# Patient Record
Sex: Female | Born: 1955 | Race: Black or African American | Hispanic: No | Marital: Married | Smoking: Never smoker
Health system: Southern US, Community
[De-identification: ages and names within clinical notes are randomized; demographics above are authoritative.]

## PROBLEM LIST (undated history)

## (undated) DIAGNOSIS — E785 Hyperlipidemia, unspecified: Secondary | ICD-10-CM

## (undated) HISTORY — PX: ABDOMINAL HYSTERECTOMY: SHX81

---

## 2000-05-30 ENCOUNTER — Other Ambulatory Visit: Admission: RE | Admit: 2000-05-30 | Discharge: 2000-05-30 | Payer: Self-pay | Admitting: Gynecology

## 2001-12-12 ENCOUNTER — Other Ambulatory Visit: Admission: RE | Admit: 2001-12-12 | Discharge: 2001-12-12 | Payer: Self-pay | Admitting: Gynecology

## 2003-02-25 ENCOUNTER — Other Ambulatory Visit: Admission: RE | Admit: 2003-02-25 | Discharge: 2003-02-25 | Payer: Self-pay | Admitting: Gynecology

## 2003-05-06 ENCOUNTER — Inpatient Hospital Stay (HOSPITAL_COMMUNITY): Admission: RE | Admit: 2003-05-06 | Discharge: 2003-05-08 | Payer: Self-pay | Admitting: Gynecology

## 2004-11-06 ENCOUNTER — Encounter: Admission: RE | Admit: 2004-11-06 | Discharge: 2004-11-06 | Payer: Self-pay | Admitting: Family Medicine

## 2006-06-10 ENCOUNTER — Ambulatory Visit (HOSPITAL_COMMUNITY): Admission: RE | Admit: 2006-06-10 | Discharge: 2006-06-10 | Payer: Self-pay | Admitting: Gastroenterology

## 2014-06-27 ENCOUNTER — Other Ambulatory Visit: Payer: Self-pay | Admitting: Gynecology

## 2014-06-28 LAB — CYTOLOGY - PAP

## 2014-07-28 ENCOUNTER — Emergency Department (HOSPITAL_COMMUNITY)
Admission: EM | Admit: 2014-07-28 | Discharge: 2014-07-28 | Disposition: A | Discharge status: Home / Self Care | Payer: 59 | Source: Home / Self Care | Attending: Family Medicine | Admitting: Family Medicine

## 2014-07-28 ENCOUNTER — Encounter (HOSPITAL_COMMUNITY): Payer: Self-pay | Admitting: Emergency Medicine

## 2014-07-28 DIAGNOSIS — M7541 Impingement syndrome of right shoulder: Secondary | ICD-10-CM

## 2014-07-28 DIAGNOSIS — M25819 Other specified joint disorders, unspecified shoulder: Secondary | ICD-10-CM

## 2014-07-28 DIAGNOSIS — M7580 Other shoulder lesions, unspecified shoulder: Secondary | ICD-10-CM

## 2014-07-28 HISTORY — DX: Hyperlipidemia, unspecified: E78.5

## 2014-07-28 MED ORDER — TRAMADOL HCL 50 MG PO TABS: 50.0000 mg | ORAL_TABLET | Freq: Four times a day (QID) | ORAL | Status: DC | PRN

## 2014-07-28 MED ORDER — PREDNISONE 5 MG PO KIT: PACK | ORAL | Status: DC

## 2014-07-28 NOTE — ED Notes (Signed)
Assessment per Dr. Corey. 

## 2014-07-28 NOTE — ED Provider Notes (Signed)
Kimberly Sims is a 58 y.o. female who presents to Urgent Care today for shoulder pain. Patient is a three-day history of worsening right shoulder pain. She denies any injury. She notes the pain is severe and interfering with sleep. Additionally the pain is worse dramatically with overhand motion. She's unable to abduct her arm because of pain. Additionally reaching back causes severe pain. She denies any radiating pain weakness or numbness. She's tried multiple over-the-counter pain medications which have not helped any.   Past Medical History  Diagnosis Date  . Hyperlipidemia    History  Substance Use Topics  . Smoking status: Not on file  . Smokeless tobacco: Not on file  . Alcohol Use: No   ROS as above Medications: No current facility-administered medications for this encounter.   Current Outpatient Prescriptions  Medication Sig Dispense Refill  . Brimonidine Tartrate-Timolol (COMBIGAN OP) Apply to eye.      . Pitavastatin Calcium (LIVALO PO) Take by mouth.      . PredniSONE 5 MG KIT 12 day dosepack po  1 kit  0  . traMADol (ULTRAM) 50 MG tablet Take 1 tablet (50 mg total) by mouth every 6 (six) hours as needed.  15 tablet  0    Exam:  BP 138/68  Pulse 85  Temp(Src) 98 F (36.7 C) (Oral)  Resp 14  SpO2 100% Gen: Well NAD HEENT: EOMI,  MMM Lungs: Normal work of breathing. CTABL Heart: RRR no MRG Abd: NABS, Soft. Nondistended, Nontender Exts: Brisk capillary refill, warm and well perfused.  Neck: Nontender spinal midline. Normal neck range of motion. Right shoulder. Moderately tender palpation. Abduction is significantly limited to 70 active and passive. External rotation limited to 30 beyond the neutral position active and passive. Strength is diminished secondary to pain. Patient is unable to tolerate positioning for impingement testing.  Capillary refill pulses and sensation are intact bilateral upper extremities distally.  No results found for this or any previous  visit (from the past 24 hour(s)). No results found.  Assessment and Plan: 58 y.o. female with subacromial impingement or bursitis versus frozen shoulder. The rapidity of the severity onset is unlikely to be consistent with frozen shoulder. I am more suspicious of subacromial bursitis/impingement. Discussed options and offered corticosteroid injection. Patient declined. Will attempt trial of prednisone dose pack range of motion exercises and tramadol for pain control. Followup at sports medicine for further evaluation and management of this issue.  Discussed warning signs or symptoms. Please see discharge instructions. Patient expresses understanding.   This note was created using Systems analyst. Any transcription errors are unintended.    Gregor Hams, MD 07/28/14 [phone]

## 2014-07-28 NOTE — Discharge Instructions (Signed)
Thank you for coming in today. Do the gentle range of motion exercises we talked about.  Did not do exercises were the pain is greater than a 3/10. Take prednisone as directed Use tramadol for severe pain Followup with Dr. Katrinka Blazing   Impingement Syndrome, Rotator Cuff, Bursitis with Rehab Impingement syndrome is a condition that involves inflammation of the tendons of the rotator cuff and the subacromial bursa, that causes pain in the shoulder. The rotator cuff consists of four tendons and muscles that control much of the shoulder and upper arm function. The subacromial bursa is a fluid filled sac that helps reduce friction between the rotator cuff and one of the bones of the shoulder (acromion). Impingement syndrome is usually an overuse injury that causes swelling of the bursa (bursitis), swelling of the tendon (tendonitis), and/or a tear of the tendon (strain). Strains are classified into three categories. Grade 1 strains cause pain, but the tendon is not lengthened. Grade 2 strains include a lengthened ligament, due to the ligament being stretched or partially ruptured. With grade 2 strains there is still function, although the function may be decreased. Grade 3 strains include a complete tear of the tendon or muscle, and function is usually impaired. SYMPTOMS   Pain around the shoulder, often at the outer portion of the upper arm.  Pain that gets worse with shoulder function, especially when reaching overhead or lifting.  Sometimes, aching when not using the arm.  Pain that wakes you up at night.  Sometimes, tenderness, swelling, warmth, or redness over the affected area.  Loss of strength.  Limited motion of the shoulder, especially reaching behind the back (to the back pocket or to unhook bra) or across your body.  Crackling sound (crepitation) when moving the arm.  Biceps tendon pain and inflammation (in the front of the shoulder). Worse when bending the elbow or lifting. CAUSES    Impingement syndrome is often an overuse injury, in which chronic (repetitive) motions cause the tendons or bursa to become inflamed. A strain occurs when a force is paced on the tendon or muscle that is greater than it can withstand. Common mechanisms of injury include: Stress from sudden increase in duration, frequency, or intensity of training.  Direct hit (trauma) to the shoulder.  Aging, erosion of the tendon with normal use.  Bony bump on shoulder (acromial spur). RISK INCREASES WITH:  Contact sports (football, wrestling, boxing).  Throwing sports (baseball, tennis, volleyball).  Weightlifting and bodybuilding.  Heavy labor.  Previous injury to the rotator cuff, including impingement.  Poor shoulder strength and flexibility.  Failure to warm up properly before activity.  Inadequate protective equipment.  Old age.  Bony bump on shoulder (acromial spur). PREVENTION   Warm up and stretch properly before activity.  Allow for adequate recovery between workouts.  Maintain physical fitness:  Strength, flexibility, and endurance.  Cardiovascular fitness.  Learn and use proper exercise technique. PROGNOSIS  If treated properly, impingement syndrome usually goes away within 6 weeks. Sometimes surgery is required.  RELATED COMPLICATIONS   Longer healing time if not properly treated, or if not given enough time to heal.  Recurring symptoms, that result in a chronic condition.  Shoulder stiffness, frozen shoulder, or loss of motion.  Rotator cuff tendon tear.  Recurring symptoms, especially if activity is resumed too soon, with overuse, with a direct blow, or when using poor technique. TREATMENT  Treatment first involves the use of ice and medicine, to reduce pain and inflammation. The use of strengthening  and stretching exercises may help reduce pain with activity. These exercises may be performed at home or with a therapist. If non-surgical treatment is  unsuccessful after more than 6 months, surgery may be advised. After surgery and rehabilitation, activity is usually possible in 3 months.  MEDICATION  If pain medicine is needed, nonsteroidal anti-inflammatory medicines (aspirin and ibuprofen), or other minor pain relievers (acetaminophen), are often advised.  Do not take pain medicine for 7 days before surgery.  Prescription pain relievers may be given, if your caregiver thinks they are needed. Use only as directed and only as much as you need.  Corticosteroid injections may be given by your caregiver. These injections should be reserved for the most serious cases, because they may only be given a certain number of times. HEAT AND COLD  Cold treatment (icing) should be applied for 10 to 15 minutes every 2 to 3 hours for inflammation and pain, and immediately after activity that aggravates your symptoms. Use ice packs or an ice massage.  Heat treatment may be used before performing stretching and strengthening activities prescribed by your caregiver, physical therapist, or athletic trainer. Use a heat pack or a warm water soak. SEEK MEDICAL CARE IF:   Symptoms get worse or do not improve in 4 to 6 weeks, despite treatment.  New, unexplained symptoms develop. (Drugs used in treatment may produce side effects.) EXERCISES  RANGE OF MOTION (ROM) AND STRETCHING EXERCISES - Impingement Syndrome (Rotator Cuff  Tendinitis, Bursitis) These exercises may help you when beginning to rehabilitate your injury. Your symptoms may go away with or without further involvement from your physician, physical therapist or athletic trainer. While completing these exercises, remember:   Restoring tissue flexibility helps normal motion to return to the joints. This allows healthier, less painful movement and activity.  An effective stretch should be held for at least 30 seconds.  A stretch should never be painful. You should only feel a gentle lengthening or  release in the stretched tissue. STRETCH - Flexion, Standing  Stand with good posture. With an underhand grip on your right / left hand, and an overhand grip on the opposite hand, grasp a broomstick or cane so that your hands are a little more than shoulder width apart.  Keeping your right / left elbow straight and shoulder muscles relaxed, push the stick with your opposite hand, to raise your right / left arm in front of your body and then overhead. Raise your arm until you feel a stretch in your right / left shoulder, but before you have increased shoulder pain.  Try to avoid shrugging your right / left shoulder as your arm rises, by keeping your shoulder blade tucked down and toward your mid-back spine. Hold for __________ seconds.  Slowly return to the starting position. Repeat __________ times. Complete this exercise __________ times per day. STRETCH - Abduction, Supine  Lie on your back. With an underhand grip on your right / left hand and an overhand grip on the opposite hand, grasp a broomstick or cane so that your hands are a little more than shoulder width apart.  Keeping your right / left elbow straight and your shoulder muscles relaxed, push the stick with your opposite hand, to raise your right / left arm out to the side of your body and then overhead. Raise your arm until you feel a stretch in your right / left shoulder, but before you have increased shoulder pain.  Try to avoid shrugging your right / left shoulder  as your arm rises, by keeping your shoulder blade tucked down and toward your mid-back spine. Hold for __________ seconds.  Slowly return to the starting position. Repeat __________ times. Complete this exercise __________ times per day. ROM - Flexion, Active-Assisted  Lie on your back. You may bend your knees for comfort.  Grasp a broomstick or cane so your hands are about shoulder width apart. Your right / left hand should grip the end of the stick, so that your  hand is positioned "thumbs-up," as if you were about to shake hands.  Using your healthy arm to lead, raise your right / left arm overhead, until you feel a gentle stretch in your shoulder. Hold for __________ seconds.  Use the stick to assist in returning your right / left arm to its starting position. Repeat __________ times. Complete this exercise __________ times per day.  ROM - Internal Rotation, Supine   Lie on your back on a firm surface. Place your right / left elbow about 60 degrees away from your side. Elevate your elbow with a folded towel, so that the elbow and shoulder are the same height.  Using a broomstick or cane and your strong arm, pull your right / left hand toward your body until you feel a gentle stretch, but no increase in your shoulder pain. Keep your shoulder and elbow in place throughout the exercise.  Hold for __________ seconds. Slowly return to the starting position. Repeat __________ times. Complete this exercise __________ times per day. STRETCH - Internal Rotation  Place your right / left hand behind your back, palm up.  Throw a towel or belt over your opposite shoulder. Grasp the towel with your right / left hand.  While keeping an upright posture, gently pull up on the towel, until you feel a stretch in the front of your right / left shoulder.  Avoid shrugging your right / left shoulder as your arm rises, by keeping your shoulder blade tucked down and toward your mid-back spine.  Hold for __________ seconds. Release the stretch, by lowering your healthy hand. Repeat __________ times. Complete this exercise __________ times per day. ROM - Internal Rotation   Using an underhand grip, grasp a stick behind your back with both hands.  While standing upright with good posture, slide the stick up your back until you feel a mild stretch in the front of your shoulder.  Hold for __________ seconds. Slowly return to your starting position. Repeat __________  times. Complete this exercise __________ times per day.  STRETCH - Posterior Shoulder Capsule   Stand or sit with good posture. Grasp your right / left elbow and draw it across your chest, keeping it at the same height as your shoulder.  Pull your elbow, so your upper arm comes in closer to your chest. Pull until you feel a gentle stretch in the back of your shoulder.  Hold for __________ seconds. Repeat __________ times. Complete this exercise __________ times per day.  Document Released: 11/08/2005 Document Revised: 01/31/2012 Document Reviewed: 02/20/2009 [facility] Patient Information 2015 [place], [place]. This information is not intended to replace advice given to you by your health care provider. Make sure you discuss any questions you have with your health care provider.

## 2014-09-18 ENCOUNTER — Encounter: Payer: Self-pay | Admitting: Family Medicine

## 2014-09-18 ENCOUNTER — Ambulatory Visit (INDEPENDENT_AMBULATORY_CARE_PROVIDER_SITE_OTHER): Payer: 59 | Admitting: Family Medicine

## 2014-09-18 ENCOUNTER — Other Ambulatory Visit (INDEPENDENT_AMBULATORY_CARE_PROVIDER_SITE_OTHER): Payer: 59

## 2014-09-18 VITALS — BP 130/82 | HR 73 | Ht 67.0 in | Wt 142.0 lb

## 2014-09-18 DIAGNOSIS — M7551 Bursitis of right shoulder: Secondary | ICD-10-CM

## 2014-09-18 NOTE — Patient Instructions (Addendum)
Very nice to meet you Ice 20 minutes 2 times daily. Usually after activity and before bed. Exercises 3 times a week.  Vitamin D 2000 IU daily.  Turmeric 500mg  twice daily Come back and see m e again in 3 weeks

## 2014-09-18 NOTE — Progress Notes (Signed)
Kimberly Sims [address] [place], [place] [address] Phone: [phone] Subjective:    I'm seeing this patient by the request  of:  Dr. Denyse Amass  CC: right shoulder pain  [id number] Kimberly Sims is a 58 y.o. female coming in with complaint of right shoulder pain. Patient was seen greater than 6 weeks ago in urgent care for a right shoulder impingement. Patient states unfortunately her pain did get better for a short course of time but then seemed to get worse. Patient states that she has a severe pain that even interfering with her sleep. Patient states it is worse with overhead activity or trying to reach but kind her back. Patient denies any significant radiation down the arm or any weakness or numbness. Patient puts the severity of pain a 7 out of 10. Patient has tried many over-the-counter medications without any significant improvement. Patient was given prednisone as well as tramadol at urgent care which was helpful. Unfortunately when she ran out of medications the pain seemed to come back.     Past medical history, social, surgical and family history all reviewed in electronic medical record.   Review of Systems: No headache, visual changes, nausea, vomiting, diarrhea, constipation, dizziness, abdominal pain, skin rash, fevers, chills, night sweats, weight loss, swollen lymph nodes, body aches, joint swelling, muscle aches, chest pain, shortness of breath, mood changes.   Objective Blood pressure 130/82, pulse 73, height 5\' 7"  (1.702 m), weight 142 lb (64.411 kg), SpO2 97.00%.  General: No apparent distress alert and oriented x3 mood and affect normal, dressed appropriately.  HEENT: Pupils equal, extraocular movements intact  Respiratory: Patient's speak in full sentences and does not appear short of breath  Cardiovascular: No lower extremity edema, non tender, no erythema  Skin: Warm dry intact with no signs of infection or rash on extremities or on axial  skeleton.  Abdomen: Soft nontender  Neuro: Cranial nerves II through XII are intact, neurovascularly intact in all extremities with 2+ DTRs and 2+ pulses.  Lymph: No lymphadenopathy of posterior or anterior cervical chain or axillae bilaterally.  Gait normal with good balance and coordination.  MSK:  Non tender with full range of motion and good stability and symmetric strength and tone of  elbows, wrist, hip, knee and ankles bilaterally.  Shoulder: Right Inspection reveals no abnormalities, atrophy or asymmetry. Palpation is normal with no tenderness over AC joint or bicipital groove. ROM is full in all planes passively. Rotator cuff strength normal throughout. signs of impingement with positive Neer and Hawkin's tests, but negative empty can sign. Speeds and Yergason's tests normal. No labral pathology noted with negative Obrien's, negative clunk and good stability. Normal scapular function observed. No painful arc and no drop arm sign. No apprehension sign  MSK performed of: Right This study was ordered, performed, and interpreted by [place] D.O.  Shoulder:   Supraspinatus:  Appears normal on long and transverse views, Bursal bulge seen with shoulder abduction on impingement view. Infraspinatus:  Appears normal on long and transverse views. Significant increase in Doppler flow Subscapularis:  Appears normal on long and transverse views. Positive bursa Teres Minor:  Appears normal on long and transverse views. AC joint:  Capsule undistended, no geyser sign. Glenohumeral Joint:  Appears normal without effusion. Glenoid Labrum:  Intact without visualized tears. Biceps Tendon:  Appears normal on long and transverse views, no fraying of tendon, tendon located in intertubercular groove, no subluxation with shoulder internal or external rotation.  Impression: Subacromial bursitis  Procedure: Real-time Ultrasound Guided Injection of right glenohumeral joint Device: GE Logiq E    Ultrasound guided injection is preferred based studies that show increased duration, increased effect, greater accuracy, decreased procedural pain, increased response rate with ultrasound guided versus blind injection.  Verbal informed consent obtained.  Time-out conducted.  Noted no overlying erythema, induration, or other signs of local infection.  Skin prepped in a sterile fashion.  Local anesthesia: Topical Ethyl chloride.  With sterile technique and under real time ultrasound guidance:  Joint visualized.  23g 1  inch needle inserted posterior approach. Pictures taken for needle placement. Patient did have injection of 2 cc of 1% lidocaine, 2 cc of 0.5% Marcaine, and 1.0 cc of Kenalog 40 mg/dL. Completed without difficulty  Pain immediately resolved suggesting accurate placement of the medication.  Advised to call if fevers/chills, erythema, induration, drainage, or persistent bleeding.  Images permanently stored and available for review in the ultrasound unit.  Impression: Technically successful ultrasound guided injection.    Impression and Recommendations:     This case required medical decision making of moderate complexity.

## 2014-09-18 NOTE — Assessment & Plan Note (Signed)
Patient had an injection today and tolerated the procedure very well. We discussed home exercises, icing protocol, as well as over-the-counter medications he can be beneficial. Patient will try these interventions and come back and see me in 3 weeks for further evaluation and treatment.

## 2014-10-10 ENCOUNTER — Ambulatory Visit (INDEPENDENT_AMBULATORY_CARE_PROVIDER_SITE_OTHER): Payer: 59 | Admitting: Family Medicine

## 2014-10-10 VITALS — BP 118/72 | HR 78 | Ht 67.0 in | Wt 141.0 lb

## 2014-10-10 DIAGNOSIS — M7551 Bursitis of right shoulder: Secondary | ICD-10-CM

## 2014-10-10 NOTE — Assessment & Plan Note (Signed)
Patient's pain is near completely resolved at this time and is asymptomatic on exam today. We discussed icing protocol. We discussed continuing the exercises 2 times a week for the next 6 weeks. Lungs patient continues to improve she will follow-up on an as-needed basis.

## 2014-10-10 NOTE — Patient Instructions (Addendum)
Good to see you Ice will always will be your friend.  Exercises 2 times a week for 6 weeks See me when you need me.

## 2014-10-10 NOTE — Progress Notes (Signed)
  [facility] [address] [place], [place] [address] Phone: [phone] Subjective:    I'm seeing this patient by the request  of:  Dr. Denyse Amass  CC: right shoulder pain follow up [id number] Kimberly Sims is a 58 y.o. female coming in with complaint of right shoulder pain. Patient was seen and had more of a subacromial bursitis giving her the impingement syndrome of the right shoulder. Patient was given a corticosteroid injection, home exercises, icing protocol. Patient states she is 90% better. No real pain, can do all ADL's. Patient states that she is resting comfortably. No new symptoms.     Past medical history, social, surgical and family history all reviewed in electronic medical record.   Review of Systems: No headache, visual changes, nausea, vomiting, diarrhea, constipation, dizziness, abdominal pain, skin rash, fevers, chills, night sweats, weight loss, swollen lymph nodes, body aches, joint swelling, muscle aches, chest pain, shortness of breath, mood changes.   Objective Blood pressure 118/72, pulse 78, height 5\' 7"  (1.702 m), weight 141 lb (63.957 kg), SpO2 98 %.  General: No apparent distress alert and oriented x3 mood and affect normal, dressed appropriately.  HEENT: Pupils equal, extraocular movements intact  Respiratory: Patient's speak in full sentences and does not appear short of breath  Cardiovascular: No lower extremity edema, non tender, no erythema  Skin: Warm dry intact with no signs of infection or rash on extremities or on axial skeleton.  Abdomen: Soft nontender  Neuro: Cranial nerves II through XII are intact, neurovascularly intact in all extremities with 2+ DTRs and 2+ pulses.  Lymph: No lymphadenopathy of posterior or anterior cervical chain or axillae bilaterally.  Gait normal with good balance and coordination.  MSK:  Non tender with full range of motion and good stability and symmetric strength and tone of  elbows, wrist, hip,  knee and ankles bilaterally.  Shoulder: Right Inspection reveals no abnormalities, atrophy or asymmetry. Palpation is normal with no tenderness over AC joint or bicipital groove. ROM is full in all planes passively. Rotator cuff strength normal throughout. Negative impingement signs Speeds and Yergason's tests normal. No labral pathology noted with negative Obrien's, negative clunk and good stability. Normal scapular function observed. No painful arc and no drop arm sign. No apprehension sign    Impression and Recommendations:     This case required medical decision making of moderate complexity.

## 2015-07-29 ENCOUNTER — Encounter (HOSPITAL_COMMUNITY): Payer: Self-pay | Admitting: Emergency Medicine

## 2015-07-29 ENCOUNTER — Emergency Department (HOSPITAL_COMMUNITY)
Admission: EM | Admit: 2015-07-29 | Discharge: 2015-07-29 | Disposition: A | Discharge status: Home / Self Care | Payer: 59 | Source: Home / Self Care | Attending: Emergency Medicine | Admitting: Emergency Medicine

## 2015-07-29 DIAGNOSIS — M79641 Pain in right hand: Secondary | ICD-10-CM

## 2015-07-29 MED ORDER — KETOROLAC TROMETHAMINE 60 MG/2ML IM SOLN
60.0000 mg | Freq: Once | INTRAMUSCULAR | Status: AC
  Administered 2015-07-29: 60 mg via INTRAMUSCULAR

## 2015-07-29 MED ORDER — CEPHALEXIN 500 MG PO CAPS: 500.0000 mg | ORAL_CAPSULE | Freq: Four times a day (QID) | ORAL | Status: DC

## 2015-07-29 MED ORDER — NAPROXEN 500 MG PO TABS: 500.0000 mg | ORAL_TABLET | Freq: Two times a day (BID) | ORAL | Status: DC

## 2015-07-29 MED ORDER — TRAMADOL HCL 50 MG PO TABS: 50.0000 mg | ORAL_TABLET | Freq: Four times a day (QID) | ORAL | Status: DC | PRN

## 2015-07-29 MED ORDER — KETOROLAC TROMETHAMINE 60 MG/2ML IM SOLN
INTRAMUSCULAR | Status: AC
  Filled 2015-07-29: qty 2

## 2015-07-29 NOTE — ED Notes (Signed)
Pt states she has pain in her 3rd finger on her right hand that radiates to her wrist and elbow.  She denies any injury to the hand or arm, but states she did yard work yesterday.  She has a history of what she called tendonitis in her right shoulder about one year ago.  Pt states the pain started last night and is getting progressively worse with swelling in her hand as well.

## 2015-07-29 NOTE — Discharge Instructions (Signed)
You have likely irritated the flexor tendon of your middle finger. With the swelling and mild redness, we are also going to treat for infection. Take Naprosyn twice a day for the next week, then as needed. Take Keflex 4 times a day for the next week. Use tramadol every 6-8 hours as needed for severe pain. Do not drive while taking this medicine. Apply ice to the palmar hand and forearm at least 3 times a day. Please follow-up with your primary care doctor in 2-3 days for a recheck. If symptoms are worsening, please come back.

## 2015-07-29 NOTE — ED Provider Notes (Signed)
CSN: [id number]     Arrival date & time 07/29/15  1302 History   First MD Initiated Contact with Patient 07/29/15 1329     Chief Complaint  Patient presents with  . Hand Pain  . Arm Pain   (Consider location/radiation/quality/duration/timing/severity/associated sxs/prior Treatment) HPI  She is a 59 year old woman here for evaluation of right hand and forearm pain. She states she did yard work yesterday which is unusual for her. A few hours later she developed pain in her right middle finger that radiates up her palm and into the forearm. It is worse with movement of the middle finger. She also reports some pain with movement of the other digits, but not as bad as with the middle finger. She also reports some mild discomfort with pronation and supination. She reports swelling in the middle finger and some redness. She is unable to hold a fork work brush her hair due to the pain. She is right-handed. She denies any specific injury or trauma. No fevers or chills.  Past Medical History  Diagnosis Date  . Hyperlipidemia    Past Surgical History  Procedure Laterality Date  . Abdominal hysterectomy     History reviewed. No pertinent family history. Social History  Substance Use Topics  . Smoking status: Never Smoker   . Smokeless tobacco: None  . Alcohol Use: No   OB History    No data available     Review of Systems As in history of present illness Allergies  Darvon and Motrin  Home Medications   Prior to Admission medications   Medication Sig Start Date End Date Taking? Authorizing Provider  Brimonidine Tartrate-Timolol (COMBIGAN OP) Apply to eye.    Historical Provider, MD  cephALEXin (KEFLEX) 500 MG capsule Take 1 capsule (500 mg total) by mouth 4 (four) times daily. 07/29/15   Charm Rings, MD  naproxen (NAPROSYN) 500 MG tablet Take 1 tablet (500 mg total) by mouth 2 (two) times daily. 07/29/15   Charm Rings, MD  Pitavastatin Calcium (LIVALO PO) Take by mouth.    Historical  Provider, MD  traMADol (ULTRAM) 50 MG tablet Take 1 tablet (50 mg total) by mouth every 6 (six) hours as needed. 07/29/15   Charm Rings, MD   Meds Ordered and Administered this Visit   Medications  ketorolac (TORADOL) injection 60 mg (not administered)    BP 152/89 mmHg  Pulse 76  Temp(Src) 99.8 F (37.7 C) (Oral)  Resp 16  SpO2 97% No data found.   Physical Exam  Constitutional: She is oriented to person, place, and time. She appears well-developed and well-nourished. No distress.  Visibly uncomfortable with exam of hand.  Cardiovascular: Normal rate.   Pulmonary/Chest: Effort normal.  Musculoskeletal:  Right hand: There is some swelling and erythema, most prominently at the proximal palmar middle finger. No tenderness to palpation along the middle finger. She does have some tenderness to palpation of the volar forearm. She has pain with both passive and active flexion and extension of the middle finger. Brisk cap refill in all digits.  Neurological: She is alert and oriented to person, place, and time.    ED Course  Procedures (including critical care time)  Labs Review Labs Reviewed - No data to display  Imaging Review No results found.    MDM   1. Right hand pain    She most likely irritated her flexor tendon doing yard work yesterday. However, she does have a low-grade temperature at 99.8 and there  is some swelling and redness. Toradol 60 mg IM given for pain. We'll cover for tendinitis with Naprosyn and tramadol. Recommended frequent icing. Keflex for one week to cover possible infection. Follow-up with PCP in 2-3 days for recheck. Return precautions reviewed.    Charm Rings, MD 07/29/15 1400

## 2015-12-10 MED FILL — COMBIGAN EYE DROPS: 0.2-0.5 | 15 days supply | Qty: 5 | Fill #1

## 2015-12-15 DIAGNOSIS — H04123 Dry eye syndrome of bilateral lacrimal glands: Secondary | ICD-10-CM | POA: Diagnosis not present

## 2015-12-15 DIAGNOSIS — H25011 Cortical age-related cataract, right eye: Secondary | ICD-10-CM | POA: Diagnosis not present

## 2015-12-15 DIAGNOSIS — H2511 Age-related nuclear cataract, right eye: Secondary | ICD-10-CM | POA: Diagnosis not present

## 2015-12-15 DIAGNOSIS — H401130 Primary open-angle glaucoma, bilateral, stage unspecified: Secondary | ICD-10-CM | POA: Diagnosis not present

## 2016-01-08 MED FILL — TRAVATAN Z 0.004% EYE DROP: 0.004 | 25 days supply | Qty: 3 | Fill #2

## 2016-01-08 MED FILL — COMBIGAN EYE DROPS: 0.2-0.5 | 15 days supply | Qty: 5 | Fill #2

## 2016-01-08 MED FILL — VIT D2 1.25 MG (50,000 UNIT: 1.25 MG | 84 days supply | Qty: 12 | Fill #0

## 2016-01-08 MED FILL — DORZOLAMIDE HCL 2% EYE DROP: 2 | 50 days supply | Qty: 10 | Fill #2

## 2016-02-11 ENCOUNTER — Encounter: Payer: Self-pay | Admitting: Family Medicine

## 2016-02-11 ENCOUNTER — Ambulatory Visit (INDEPENDENT_AMBULATORY_CARE_PROVIDER_SITE_OTHER): Payer: 59 | Admitting: Family Medicine

## 2016-02-11 ENCOUNTER — Other Ambulatory Visit (INDEPENDENT_AMBULATORY_CARE_PROVIDER_SITE_OTHER): Payer: 59

## 2016-02-11 ENCOUNTER — Ambulatory Visit (INDEPENDENT_AMBULATORY_CARE_PROVIDER_SITE_OTHER)
Admission: RE | Admit: 2016-02-11 | Discharge: 2016-02-11 | Disposition: A | Discharge status: Home / Self Care | Payer: 59 | Source: Ambulatory Visit | Attending: Family Medicine | Admitting: Family Medicine

## 2016-02-11 VITALS — BP 126/80 | HR 62 | Wt 148.0 lb

## 2016-02-11 DIAGNOSIS — M47812 Spondylosis without myelopathy or radiculopathy, cervical region: Secondary | ICD-10-CM | POA: Diagnosis not present

## 2016-02-11 DIAGNOSIS — M25512 Pain in left shoulder: Secondary | ICD-10-CM

## 2016-02-11 DIAGNOSIS — M25511 Pain in right shoulder: Secondary | ICD-10-CM

## 2016-02-11 DIAGNOSIS — M5010 Cervical disc disorder with radiculopathy, unspecified cervical region: Secondary | ICD-10-CM | POA: Insufficient documentation

## 2016-02-11 MED ORDER — DICLOFENAC SODIUM 2 % TD SOLN: TRANSDERMAL | Status: AC

## 2016-02-11 NOTE — Assessment & Plan Note (Signed)
Patient given injection in the shoulders and a bilaterally. I'm hoping that this will help. I am concerned the patient is having more cervical radiculopathy. Patient will have x-rays done today. Discussed icing, given prescription for topical anti-inflammatories with patient wondering to avoid medications. Patient also wants to avoid any formal physical therapy at this time. Patient coming back and see me again in 2 weeks. If worsening symptoms we'll need to consider advanced imaging of the neck.

## 2016-02-11 NOTE — Progress Notes (Signed)
Pre visit review using our clinic review tool, if applicable. No additional management support is needed unless otherwise documented below in the visit note. 

## 2016-02-11 NOTE — Patient Instructions (Signed)
Good to see you  Ice 20 minutes 2 times daily. Usually after activity and before bed. pennsaid pinkie amount topically 2 times daily as needed.  Avoid any movement where hands are out of your peripheral vision  Stay active though and keep moving.  Vitamin D 2000 IU daily  Xray of neck downstairs today  See me again in 2 weeks. If not better we will need to look at your neck a lot closer.

## 2016-02-11 NOTE — Progress Notes (Signed)
[facility] [address] [place], [place] [address] Phone: [phone] Subjective:      CC: Bilateral shoulder pain  [name] Kimberly Sims is a 60 y.o. female coming in with complaint of right shoulder pain. Patient was seen and a half ago and was diagnosed with more of a bursitis of the right shoulder. Patient was given an injection, home exercises as well as some medications. Patient was doing much better. Patient states she is having bilateral shoulder pain now. Seems to be giving her some radiation down the arm. Patient states that there is some numbness in her fingers that seems to be intermittent. Patient states that she does not have any significant neck pain. Does not remember any true injury. Denies any weakness. States that though she cannot find any comfortable position at night. Patient states that she has to sleep on her back and if she will stay the shoulder she has severe amount of pain. Patient has tried over-the-counter anti-inflammatories with very minimal benefit. Patient rates the severity of pain a 6 out of 10.     Past Medical History  Diagnosis Date  . Hyperlipidemia    Past Surgical History  Procedure Laterality Date  . Abdominal hysterectomy     Social History  Substance Use Topics  . Smoking status: Never Smoker   . Smokeless tobacco: None  . Alcohol Use: No   Allergies  Allergen Reactions  . Darvon [Propoxyphene]   . Motrin [Ibuprofen]     Large doses Motrin cause n/v, GI upset   No family history on file.no family history of rheumatological diseases   Past medical history, social, surgical and family history all reviewed in electronic medical record.   Review of Systems: No headache, visual changes, nausea, vomiting, diarrhea, constipation, dizziness, abdominal pain, skin rash, fevers, chills, night sweats, weight loss, swollen lymph nodes, body aches, joint swelling, muscle aches, chest pain, shortness of breath,  mood changes.   Objective Blood pressure 126/80, pulse 62, weight 148 lb (67.132 kg).  General: No apparent distress alert and oriented x3 mood and affect normal, dressed appropriately.  HEENT: Pupils equal, extraocular movements intact  Respiratory: Patient's speak in full sentences and does not appear short of breath  Cardiovascular: No lower extremity edema, non tender, no erythema  Skin: Warm dry intact with no signs of infection or rash on extremities or on axial skeleton.  Abdomen: Soft nontender  Neuro: Cranial nerves II through XII are intact, neurovascularly intact in all extremities with 2+ DTRs and 2+ pulses.  Lymph: No lymphadenopathy of posterior or anterior cervical chain or axillae bilaterally.  Gait normal with good balance and coordination.  MSK:  Non tender with full range of motion and good stability and symmetric strength and tone of  elbows, wrist, hip, knee and ankles bilaterally.  Neck: Inspection unremarkable. No palpable stepoffs. Negative Spurling's maneuver. Full neck range of motion Grip strength and sensation normal in bilateral hands Strength good C4 to T1 distribution No sensory change to C4 to T1 Negative Hoffman sign bilaterally Reflexes normal  Shoulder: bilateral Inspection reveals no abnormalities, atrophy or asymmetry. Palpation is normal with no tenderness over AC joint or bicipital groove. ROM is full in all planes passively. Rotator cuff strength normal throughout. signs of impingement with positive Neer and Hawkin's tests, but negative empty can sign. Speeds and Yergason's tests normal. No labral pathology noted with negative Obrien's, negative clunk and good stability. Normal scapular function observed. No painful arc  and no drop arm sign. No apprehension sign  MSK US performed of: bilateral This study was ordered, performed, and interpreted by Terrilee Files D.O.  Shoulder:   Supraspinatus:  Appears normal on long and transverse views,  Bursal bulge seen with shoulder abduction on impingement view. Infraspinatus:  Appears normal on long and transverse views. Significant increase in Doppler flow Subscapularis:  Appears normal on long and transverse views. Positive bursa Teres Minor:  Appears normal on long and transverse views. AC joint:  Capsule undistended, no geyser sign. Glenohumeral Joint:  Appears normal without effusion. Glenoid Labrum:  Intact without visualized tears. Biceps Tendon:  Appears normal on long and transverse views, no fraying of tendon, tendon located in intertubercular groove, no subluxation with shoulder internal or external rotation.  Impression: Subacromial bursitis bilaterally  Procedure: Real-time Ultrasound Guided Injection of right glenohumeral joint Device: GE Logiq E  Ultrasound guided injection is preferred based studies that show increased duration, increased effect, greater accuracy, decreased procedural pain, increased response rate with ultrasound guided versus blind injection.  Verbal informed consent obtained.  Time-out conducted.  Noted no overlying erythema, induration, or other signs of local infection.  Skin prepped in a sterile fashion.  Local anesthesia: Topical Ethyl chloride.  With sterile technique and under real time ultrasound guidance:  Joint visualized.  23g 1  inch needle inserted posterior approach. Pictures taken for needle placement. Patient did have injection of 2 cc of 1% lidocaine, 2 cc of 0.5% Marcaine, and 1.0 cc of Kenalog 40 mg/dL. Completed without difficulty  Pain immediately resolved suggesting accurate placement of the medication.  Advised to call if fevers/chills, erythema, induration, drainage, or persistent bleeding.  Images permanently stored and available for review in the ultrasound unit.  Impression: Technically successful ultrasound guided injection.  ]Procedure: Real-time Ultrasound Guided Injection of left glenohumeral joint Device: GE Logiq E    Ultrasound guided injection is preferred based studies that show increased duration, increased effect, greater accuracy, decreased procedural pain, increased response rate with ultrasound guided versus blind injection.  Verbal informed consent obtained.  Time-out conducted.  Noted no overlying erythema, induration, or other signs of local infection.  Skin prepped in a sterile fashion.  Local anesthesia: Topical Ethyl chloride.  With sterile technique and under real time ultrasound guidance:  Joint visualized.  23g 1  inch needle inserted posterior approach. Pictures taken for needle placement. Patient did have injection of 2 cc of 1% lidocaine, 2 cc of 0.5% Marcaine, and 1cc of Kenalog 40 mg/dL. Completed without difficulty  Pain immediately resolved suggesting accurate placement of the medication.  Advised to call if fevers/chills, erythema, induration, drainage, or persistent bleeding.  Images permanently stored and available for review in the ultrasound unit.  Impression: Technically successful ultrasound guided injection.    Impression and Recommendations:     This case required medical decision making of moderate complexity.

## 2016-02-23 ENCOUNTER — Encounter: Payer: Self-pay | Admitting: Family Medicine

## 2016-02-23 ENCOUNTER — Ambulatory Visit (INDEPENDENT_AMBULATORY_CARE_PROVIDER_SITE_OTHER): Payer: 59 | Admitting: Family Medicine

## 2016-02-23 VITALS — BP 126/78 | HR 68 | Wt 142.0 lb

## 2016-02-23 DIAGNOSIS — M25512 Pain in left shoulder: Secondary | ICD-10-CM

## 2016-02-23 DIAGNOSIS — M5010 Cervical disc disorder with radiculopathy, unspecified cervical region: Secondary | ICD-10-CM

## 2016-02-23 DIAGNOSIS — M25511 Pain in right shoulder: Secondary | ICD-10-CM

## 2016-02-23 MED ORDER — GABAPENTIN 100 MG PO CAPS: 200.0000 mg | ORAL_CAPSULE | Freq: Every day | ORAL | Status: AC

## 2016-02-23 MED FILL — GABAPENTIN 100 MG CAPSULE: 100 | 30 days supply | Qty: 60 | Fill #0

## 2016-02-23 NOTE — Assessment & Plan Note (Signed)
Pain resolved after injection. Continues to have the radicular's. No significant weakness. We discussed the icing regimen. Decline formal physical therapy. We will continue to monitor closely. Follow-up in 6 weeks.

## 2016-02-23 NOTE — Patient Instructions (Addendum)
Good to see you  Ice is your friend Stay active and watch the posture Exercises still 2 times a week Start with gabapentin 100mg  at night for 1 week and if better continue and if not then try 200mg  at night for a week See me again in 6 weeks and send message in 3 weeks so I know how you are doing.

## 2016-02-23 NOTE — Progress Notes (Signed)
  Kimberly Sims [address] [place], [place] [address] Phone: [phone] Subjective:      CC: Bilateral shoulder painFollow-up  [id number]:[id number] Kimberly Sims is a 60 y.o. female coming in with complaint of bilateral shoulder pain. Patient was found to have some subacromial bursitis bilaterally and patient elected to have injections in each shoulder. Patient was also sent to have x-rays of her neck. These were independently visualized by me showing patient having mild osteophytic changes and cervical degenerative disc disease. Patient was given exercises, topical anti-inflammatories, icing protocol. Patient states she is doing better at this time. States that she is no longer having any pain in the shoulders. Continues to have unfortunately little numbness in the hands. Seems to be the middle finger and the thumb bilaterally. Patient states that it is only at night. No pain in the morning. Not stopping any activities.'s     Past Medical History  Diagnosis Date  . Hyperlipidemia    Past Surgical History  Procedure Laterality Date  . Abdominal hysterectomy     Social History  Substance Use Topics  . Smoking status: Never Smoker   . Smokeless tobacco: Not on file  . Alcohol Use: No   Allergies  Allergen Reactions  . Darvon [Propoxyphene]   . Motrin [Ibuprofen]     Large doses Motrin cause n/v, GI upset   No family history on file.no family history of rheumatological diseases   Past medical history, social, surgical and family history all reviewed in electronic medical record.   Review of Systems: No headache, visual changes, nausea, vomiting, diarrhea, constipation, dizziness, abdominal pain, skin rash, fevers, chills, night sweats, weight loss, swollen lymph nodes, body aches, joint swelling, muscle aches, chest pain, shortness of breath, mood changes.   Objective Blood pressure 126/78, pulse 68, weight 142 lb (64.411 kg).  General: No apparent  distress alert and oriented x3 mood and affect normal, dressed appropriately.  HEENT: Pupils equal, extraocular movements intact  Respiratory: Patient's speak in full sentences and does not appear short of breath  Cardiovascular: No lower extremity edema, non tender, no erythema  Skin: Warm dry intact with no signs of infection or rash on extremities or on axial skeleton.  Abdomen: Soft nontender  Neuro: Cranial nerves II through XII are intact, neurovascularly intact in all extremities with 2+ DTRs and 2+ pulses.  Lymph: No lymphadenopathy of posterior or anterior cervical chain or axillae bilaterally.  Gait normal with good balance and coordination.  MSK:  Non tender with full range of motion and good stability and symmetric strength and tone of  elbows, wrist, hip, knee and ankles bilaterally.  Neck: Inspection unremarkable. No palpable stepoffs. Negative Spurling's maneuver. Full neck range of motion Grip strength and sensation normal in bilateral hands Strength good C4 to T1 distribution No sensory change to C4 to T1 Negative Hoffman sign bilaterally Reflexes normal  Shoulder: bilateral Inspection reveals no abnormalities, atrophy or asymmetry. Palpation is normal with no tenderness over AC joint or bicipital groove. ROM is full in all planes passively. Rotator cuff strength normal throughout. No impingement Speeds and Yergason's tests normal. No labral pathology noted with negative Obrien's, negative clunk and good stability. Normal scapular function observed. No painful arc and no drop arm sign. No apprehension sign     Impression and Recommendations:     This case required medical decision making of moderate complexity.

## 2016-02-23 NOTE — Assessment & Plan Note (Signed)
Patient does have some arthritic changes of the neck but very mild. Patient though symptoms seem to be more of a C7 radiculopathy. Discussed different treatment options and patient has elected try the gabapentin at night. Patient will try this. Continues with the vitamin D supplementation. We'll have patient come back and see me again in 6 weeks for further evaluation. If worsening symptoms possible EMG would be of value to rule out carpal tunnel or any other median nerve neuropathy.

## 2016-02-24 MED FILL — COMBIGAN EYE DROPS: 0.2-0.5 | 15 days supply | Qty: 5 | Fill #3

## 2016-03-16 MED FILL — PREVIDENT 5000 BOOSTER PLUS: 1.1 | 30 days supply | Qty: 100 | Fill #1

## 2016-04-13 MED FILL — VIT D2 1.25 MG (50,000 UNIT: 1.25 MG | 84 days supply | Qty: 12 | Fill #0

## 2016-04-16 DIAGNOSIS — Z79899 Other long term (current) drug therapy: Secondary | ICD-10-CM | POA: Diagnosis not present

## 2016-04-16 DIAGNOSIS — E559 Vitamin D deficiency, unspecified: Secondary | ICD-10-CM | POA: Diagnosis not present

## 2016-04-16 DIAGNOSIS — Z0000 Encounter for general adult medical examination without abnormal findings: Secondary | ICD-10-CM | POA: Diagnosis not present

## 2016-04-16 DIAGNOSIS — H409 Unspecified glaucoma: Secondary | ICD-10-CM | POA: Diagnosis not present

## 2016-04-16 DIAGNOSIS — Z1211 Encounter for screening for malignant neoplasm of colon: Secondary | ICD-10-CM | POA: Diagnosis not present

## 2016-04-16 DIAGNOSIS — E7800 Pure hypercholesterolemia, unspecified: Secondary | ICD-10-CM | POA: Diagnosis not present

## 2016-04-23 DIAGNOSIS — H04123 Dry eye syndrome of bilateral lacrimal glands: Secondary | ICD-10-CM | POA: Diagnosis not present

## 2016-04-23 DIAGNOSIS — H25011 Cortical age-related cataract, right eye: Secondary | ICD-10-CM | POA: Diagnosis not present

## 2016-04-23 DIAGNOSIS — H401130 Primary open-angle glaucoma, bilateral, stage unspecified: Secondary | ICD-10-CM | POA: Diagnosis not present

## 2016-04-23 DIAGNOSIS — H2513 Age-related nuclear cataract, bilateral: Secondary | ICD-10-CM | POA: Diagnosis not present

## 2016-05-06 MED FILL — COMBIGAN EYE DROPS: 0.2-0.5 | 25 days supply | Qty: 5 | Fill #3

## 2016-05-06 MED FILL — DORZOLAMIDE HCL 2% EYE DROP: 2 | 50 days supply | Qty: 10 | Fill #3

## 2016-05-07 MED FILL — LIVALO 1 MG TABLET: 1 | 30 days supply | Qty: 30 | Fill #0

## 2016-05-10 MED FILL — TRAVATAN Z 0.004% EYE DROP: 0.004 | 25 days supply | Qty: 3 | Fill #0

## 2016-06-03 MED FILL — GAVILYTE-N SOLUTION: 420 | 1 days supply | Qty: 4000 | Fill #0

## 2016-06-06 ENCOUNTER — Ambulatory Visit (HOSPITAL_COMMUNITY)
Admission: EM | Admit: 2016-06-06 | Discharge: 2016-06-06 | Disposition: A | Discharge status: Home / Self Care | Payer: 59

## 2016-06-06 DIAGNOSIS — M25511 Pain in right shoulder: Secondary | ICD-10-CM | POA: Diagnosis not present

## 2016-06-24 MED FILL — COMBIGAN EYE DROPS: 0.2-0.5 | 25 days supply | Qty: 5 | Fill #0

## 2016-06-29 MED FILL — VIT D2 1.25 MG (50,000 UNIT: 1.25 MG | 84 days supply | Qty: 12 | Fill #0

## 2016-06-29 MED FILL — PREMARIN VAGINAL CREAM-APPL: 0.625 | 70 days supply | Qty: 30 | Fill #0

## 2016-07-22 DIAGNOSIS — Z800 Family history of malignant neoplasm of digestive organs: Secondary | ICD-10-CM | POA: Diagnosis not present

## 2016-07-22 DIAGNOSIS — Z1211 Encounter for screening for malignant neoplasm of colon: Secondary | ICD-10-CM | POA: Diagnosis not present

## 2016-08-10 DIAGNOSIS — Z1231 Encounter for screening mammogram for malignant neoplasm of breast: Secondary | ICD-10-CM | POA: Diagnosis not present

## 2016-08-13 ENCOUNTER — Other Ambulatory Visit: Payer: Self-pay | Admitting: Obstetrics & Gynecology

## 2016-08-13 DIAGNOSIS — R928 Other abnormal and inconclusive findings on diagnostic imaging of breast: Secondary | ICD-10-CM

## 2016-08-19 MED FILL — COMBIGAN EYE DROPS: 0.2-0.5 | 25 days supply | Qty: 5 | Fill #1

## 2016-08-19 MED FILL — TRAVATAN Z 0.004% EYE DROP: 0.004 | 25 days supply | Qty: 3 | Fill #1

## 2016-08-20 ENCOUNTER — Ambulatory Visit
Admission: RE | Admit: 2016-08-20 | Discharge: 2016-08-20 | Disposition: A | Discharge status: Home / Self Care | Payer: 59 | Source: Ambulatory Visit | Attending: Obstetrics & Gynecology | Admitting: Obstetrics & Gynecology

## 2016-08-20 DIAGNOSIS — R928 Other abnormal and inconclusive findings on diagnostic imaging of breast: Secondary | ICD-10-CM

## 2016-08-20 DIAGNOSIS — R922 Inconclusive mammogram: Secondary | ICD-10-CM | POA: Diagnosis not present

## 2016-08-20 DIAGNOSIS — N6489 Other specified disorders of breast: Secondary | ICD-10-CM | POA: Diagnosis not present

## 2016-08-20 MED FILL — PREVIDENT 5000 BOOSTER PLUS: 1.1 | 30 days supply | Qty: 100 | Fill #2

## 2016-08-20 MED FILL — DORZOLAMIDE HCL 2% EYE DRP: 2 | 50 days supply | Qty: 10 | Fill #0

## 2016-09-02 DIAGNOSIS — E7800 Pure hypercholesterolemia, unspecified: Secondary | ICD-10-CM | POA: Diagnosis not present

## 2016-09-20 MED FILL — FENOFIBRATE 160 MG TABLET: 160 | 30 days supply | Qty: 30 | Fill #0

## 2016-10-12 DIAGNOSIS — J069 Acute upper respiratory infection, unspecified: Secondary | ICD-10-CM | POA: Diagnosis not present

## 2016-10-12 MED FILL — HYDROCODONE-CHLORPHENIRAM S: 10-8 | 12 days supply | Qty: 120 | Fill #0

## 2016-10-12 MED FILL — BENZONATATE 100 MG CAPSULE: 100 | 10 days supply | Qty: 60 | Fill #0

## 2016-10-28 MED FILL — TRAVATAN Z 0.004% EYE DROP: 0.004 | 25 days supply | Qty: 3 | Fill #2

## 2016-10-28 MED FILL — VIT D2 1.25 MG (50,000 UNIT: 1.25 MG | 84 days supply | Qty: 12 | Fill #1

## 2016-10-29 MED FILL — COMBIGAN EYE DROPS: 0.2-0.5 | 25 days supply | Qty: 5 | Fill #2

## 2016-11-02 DIAGNOSIS — Z79899 Other long term (current) drug therapy: Secondary | ICD-10-CM | POA: Diagnosis not present

## 2016-11-02 DIAGNOSIS — E7800 Pure hypercholesterolemia, unspecified: Secondary | ICD-10-CM | POA: Diagnosis not present

## 2016-11-02 MED FILL — FENOFIBRATE 160 MG TABLET: 160 | 30 days supply | Qty: 30 | Fill #1

## 2016-11-03 MED FILL — AZITHROMYCIN 250 MG TABLET: 250 | 5 days supply | Qty: 6 | Fill #0

## 2016-11-25 DIAGNOSIS — H2513 Age-related nuclear cataract, bilateral: Secondary | ICD-10-CM | POA: Diagnosis not present

## 2016-11-25 DIAGNOSIS — H04123 Dry eye syndrome of bilateral lacrimal glands: Secondary | ICD-10-CM | POA: Diagnosis not present

## 2016-11-25 DIAGNOSIS — H401130 Primary open-angle glaucoma, bilateral, stage unspecified: Secondary | ICD-10-CM | POA: Diagnosis not present

## 2016-12-17 DIAGNOSIS — R05 Cough: Secondary | ICD-10-CM | POA: Diagnosis not present

## 2016-12-17 DIAGNOSIS — J329 Chronic sinusitis, unspecified: Secondary | ICD-10-CM | POA: Diagnosis not present

## 2016-12-17 DIAGNOSIS — K219 Gastro-esophageal reflux disease without esophagitis: Secondary | ICD-10-CM | POA: Diagnosis not present

## 2016-12-17 MED FILL — COMBIGAN EYE DROPS: 0.2-0.5 | 25 days supply | Qty: 5 | Fill #3

## 2016-12-17 MED FILL — AMOX TR-K CLV 875-125 MG TA: 875-125 | 10 days supply | Qty: 20 | Fill #0

## 2016-12-17 MED FILL — FLUTICASONE PROP 50 MCG SPR: 50 | 60 days supply | Qty: 16 | Fill #0

## 2017-01-27 MED FILL — VIT D2 1.25 MG (50,000 UNIT: 1.25 MG | 84 days supply | Qty: 12 | Fill #0

## 2017-01-27 MED FILL — PREVIDENT 5000 BOOSTER PLUS: 1.1 | 30 days supply | Qty: 100 | Fill #0

## 2017-02-23 ENCOUNTER — Telehealth: Payer: Self-pay | Admitting: Family Medicine

## 2017-02-23 NOTE — Telephone Encounter (Signed)
Sent patient a message that she needs appointment before refill.

## 2017-02-23 NOTE — Telephone Encounter (Signed)
Pt called requesting a refill of Diclofenac Sodium 2 % SOLN   states it helps her.   [facility]

## 2017-02-27 DIAGNOSIS — M79601 Pain in right arm: Secondary | ICD-10-CM | POA: Diagnosis not present

## 2017-02-27 DIAGNOSIS — M25531 Pain in right wrist: Secondary | ICD-10-CM | POA: Diagnosis not present

## 2017-03-15 DIAGNOSIS — R768 Other specified abnormal immunological findings in serum: Secondary | ICD-10-CM | POA: Diagnosis not present

## 2017-03-15 DIAGNOSIS — M2550 Pain in unspecified joint: Secondary | ICD-10-CM | POA: Diagnosis not present

## 2017-03-15 DIAGNOSIS — R5382 Chronic fatigue, unspecified: Secondary | ICD-10-CM | POA: Diagnosis not present

## 2017-03-15 DIAGNOSIS — Z6823 Body mass index (BMI) 23.0-23.9, adult: Secondary | ICD-10-CM | POA: Diagnosis not present

## 2017-03-18 MED FILL — DORZOLAMIDE HCL 2% EYE DRP: 2 | 50 days supply | Qty: 10 | Fill #1

## 2017-03-18 MED FILL — TRAVATAN Z 0.004% EYE DROP: 0.004 | 30 days supply | Qty: 3 | Fill #3

## 2017-03-18 MED FILL — COMBIGAN EYE DROPS: 0.2-0.5 | 25 days supply | Qty: 5 | Fill #4

## 2017-03-23 DIAGNOSIS — M0579 Rheumatoid arthritis with rheumatoid factor of multiple sites without organ or systems involvement: Secondary | ICD-10-CM | POA: Diagnosis not present

## 2017-03-23 DIAGNOSIS — R5382 Chronic fatigue, unspecified: Secondary | ICD-10-CM | POA: Diagnosis not present

## 2017-03-23 DIAGNOSIS — M2550 Pain in unspecified joint: Secondary | ICD-10-CM | POA: Diagnosis not present

## 2017-03-23 DIAGNOSIS — Z6823 Body mass index (BMI) 23.0-23.9, adult: Secondary | ICD-10-CM | POA: Diagnosis not present

## 2017-03-23 MED FILL — predniSONE 5 MG TABS: 5 | 28 days supply | Qty: 70 | Fill #0

## 2017-03-23 MED FILL — METHOTREXATE 25 MG/ML VIAL: 50 | 89 days supply | Qty: 8 | Fill #0

## 2017-03-23 MED FILL — FOLIC ACID 1 MG TABLET: 1 | 30 days supply | Qty: 30 | Fill #0

## 2017-04-22 MED FILL — FOLIC ACID 1 MG TABLET: 1 | 30 days supply | Qty: 30 | Fill #1

## 2017-05-04 MED FILL — VIT D2 1.25 MG (50,000 UNIT: 1.25 MG | 84 days supply | Qty: 12 | Fill #1

## 2017-05-11 DIAGNOSIS — M7989 Other specified soft tissue disorders: Secondary | ICD-10-CM | POA: Diagnosis not present

## 2017-05-11 DIAGNOSIS — M2550 Pain in unspecified joint: Secondary | ICD-10-CM | POA: Diagnosis not present

## 2017-05-11 DIAGNOSIS — Z6823 Body mass index (BMI) 23.0-23.9, adult: Secondary | ICD-10-CM | POA: Diagnosis not present

## 2017-05-11 DIAGNOSIS — R5382 Chronic fatigue, unspecified: Secondary | ICD-10-CM | POA: Diagnosis not present

## 2017-05-11 DIAGNOSIS — M0579 Rheumatoid arthritis with rheumatoid factor of multiple sites without organ or systems involvement: Secondary | ICD-10-CM | POA: Diagnosis not present

## 2017-05-23 MED FILL — FOLIC ACID 1 MG TABLET: 1 | 30 days supply | Qty: 30 | Fill #2

## 2017-06-02 DIAGNOSIS — H2513 Age-related nuclear cataract, bilateral: Secondary | ICD-10-CM | POA: Diagnosis not present

## 2017-06-02 DIAGNOSIS — H04123 Dry eye syndrome of bilateral lacrimal glands: Secondary | ICD-10-CM | POA: Diagnosis not present

## 2017-06-02 DIAGNOSIS — H401131 Primary open-angle glaucoma, bilateral, mild stage: Secondary | ICD-10-CM | POA: Diagnosis not present

## 2017-06-02 MED FILL — DORZOLAMIDE HCL 2% EYE DRP: 2 | 50 days supply | Qty: 10 | Fill #2

## 2017-06-02 MED FILL — COMBIGAN EYE DROPS: 0.2-0.5 | 25 days supply | Qty: 5 | Fill #5

## 2017-06-09 MED FILL — METHOTREXATE 25 MG/ML VIAL: 50 | 89 days supply | Qty: 8 | Fill #1

## 2017-06-10 DIAGNOSIS — E7800 Pure hypercholesterolemia, unspecified: Secondary | ICD-10-CM | POA: Diagnosis not present

## 2017-06-10 DIAGNOSIS — H409 Unspecified glaucoma: Secondary | ICD-10-CM | POA: Diagnosis not present

## 2017-06-10 DIAGNOSIS — E559 Vitamin D deficiency, unspecified: Secondary | ICD-10-CM | POA: Diagnosis not present

## 2017-06-10 DIAGNOSIS — M069 Rheumatoid arthritis, unspecified: Secondary | ICD-10-CM | POA: Diagnosis not present

## 2017-06-10 DIAGNOSIS — Z0000 Encounter for general adult medical examination without abnormal findings: Secondary | ICD-10-CM | POA: Diagnosis not present

## 2017-06-24 MED FILL — FOLIC ACID 1 MG TABLET: 1 | 30 days supply | Qty: 30 | Fill #3

## 2017-07-11 MED FILL — PREVIDENT 5000 BOOSTER PLUS: 1.1 | 30 days supply | Qty: 100 | Fill #1

## 2017-07-14 MED FILL — TRAVATAN Z 0.004% EYE DROP: 0.004 | 60 days supply | Qty: 5 | Fill #0

## 2017-07-26 MED FILL — COMBIGAN EYE DROPS: 0.2-0.5 | 25 days supply | Qty: 5 | Fill #0

## 2017-07-26 MED FILL — FOLIC ACID 1 MG TABLET: 1 | 30 days supply | Qty: 30 | Fill #4

## 2017-07-26 MED FILL — VIT D2 1.25 MG (50,000 UNIT: 1.25 MG | 84 days supply | Qty: 12 | Fill #0

## 2017-08-31 MED FILL — predniSONE 5 MG TABS: 5 | 28 days supply | Qty: 70 | Fill #1

## 2017-08-31 MED FILL — FOLIC ACID 1 MG TABLET: 1 | 30 days supply | Qty: 30 | Fill #5

## 2017-09-12 ENCOUNTER — Other Ambulatory Visit: Payer: Self-pay | Admitting: Obstetrics & Gynecology

## 2017-09-12 DIAGNOSIS — Z1231 Encounter for screening mammogram for malignant neoplasm of breast: Secondary | ICD-10-CM

## 2017-09-16 MED FILL — COMBIGAN EYE DROPS: 0.2-0.5 | 30 days supply | Qty: 5 | Fill #1

## 2017-09-22 MED FILL — METHOTREXATE 25 MG/ML VIAL: 50 | 90 days supply | Qty: 8 | Fill #0

## 2017-09-22 MED FILL — PREVIDENT 5000 BOOSTER PLUS: 1.1 | 30 days supply | Qty: 100 | Fill #2

## 2017-09-23 MED FILL — FOLIC ACID 1 MG TABLET: 1 | 30 days supply | Qty: 30 | Fill #0

## 2017-10-06 ENCOUNTER — Ambulatory Visit: Payer: 59

## 2017-10-26 MED FILL — VIT D2 1.25 MG (50,000 UNIT: 1.25 MG | 84 days supply | Qty: 12 | Fill #1

## 2017-11-09 MED FILL — FOLIC ACID 1 MG TABLET: 1 | 30 days supply | Qty: 30 | Fill #1

## 2017-11-28 ENCOUNTER — Ambulatory Visit
Admission: RE | Admit: 2017-11-28 | Discharge: 2017-11-28 | Disposition: A | Discharge status: Home / Self Care | Payer: BC Managed Care – PPO | Source: Ambulatory Visit | Attending: Obstetrics & Gynecology | Admitting: Obstetrics & Gynecology

## 2017-11-28 DIAGNOSIS — Z1231 Encounter for screening mammogram for malignant neoplasm of breast: Secondary | ICD-10-CM

## 2017-12-06 MED FILL — FENOFIBRATE 160 MG TABLET: 160 | 30 days supply | Qty: 30 | Fill #0

## 2017-12-06 MED FILL — FOLIC ACID 1 MG TABLET: 1 | 30 days supply | Qty: 30 | Fill #2

## 2017-12-06 MED FILL — COMBIGAN EYE DROPS: 0.2-0.5 | 30 days supply | Qty: 5 | Fill #2

## 2017-12-07 MED FILL — PREMARIN VAGINAL CREAM-APPL: 0.625 | 70 days supply | Qty: 30 | Fill #0

## 2018-02-09 MED FILL — predniSONE 5 MG TABS: 5 | 28 days supply | Qty: 70 | Fill #0

## 2018-02-09 MED FILL — VIT D2 1.25 MG (50,000 UNIT: 1.25 MG | 84 days supply | Qty: 12 | Fill #2

## 2018-02-09 MED FILL — FOLIC ACID 1 MG TABLET: 1 | 30 days supply | Qty: 30 | Fill #3

## 2018-02-14 MED FILL — METHOTREXATE 25 MG/ML VIAL: 50 | 90 days supply | Qty: 8 | Fill #0

## 2018-03-02 MED FILL — TRAVATAN Z 0.004% EYE DROP: 0.004 | 60 days supply | Qty: 5 | Fill #1

## 2018-03-02 MED FILL — COMBIGAN EYE DROPS: 0.2-0.5 | 30 days supply | Qty: 5 | Fill #3

## 2018-03-09 MED FILL — FOLIC ACID 1 MG TABS: 1 | 30 days supply | Qty: 30 | Fill #4

## 2018-03-09 MED FILL — PREVIDENT 5000 BOOSTER PLUS: 1.1 | 30 days supply | Qty: 100 | Fill #0

## 2018-04-24 MED FILL — FOLIC ACID 1 MG TABS: 1 | 30 days supply | Qty: 30 | Fill #0

## 2018-04-24 MED FILL — PREVIDENT 5000 BOOSTER PLUS: 1.1 | 30 days supply | Qty: 100 | Fill #1

## 2018-04-24 MED FILL — VIT D2 1.25 MG (50,000 UNIT: 1.25 MG | 84 days supply | Qty: 12 | Fill #3

## 2018-04-26 MED FILL — COMBIGAN EYE DROPS: 0.2-0.5 | 30 days supply | Qty: 5 | Fill #0

## 2018-06-13 MED FILL — FOLIC ACID 1 MG TABS: 1 | 30 days supply | Qty: 30 | Fill #1

## 2018-06-13 MED FILL — COMBIGAN EYE DROPS: 0.2-0.5 | 30 days supply | Qty: 5 | Fill #1

## 2018-08-10 MED FILL — COMBIGAN EYE DROPS: 0.2-0.5 | 30 days supply | Qty: 5 | Fill #2

## 2018-08-10 MED FILL — FOLIC ACID 1 MG TABS: 1 | 30 days supply | Qty: 30 | Fill #2

## 2018-08-10 MED FILL — FENOFIBRATE 160 MG TABLET: 160 | 30 days supply | Qty: 30 | Fill #1

## 2018-08-10 MED FILL — DORZOLAMIDE HCL 2% EYE DRP: 2 | 50 days supply | Qty: 10 | Fill #0

## 2018-08-10 MED FILL — VIT D2 1.25 MG (50,000 UNIT: 1.25 MG | 84 days supply | Qty: 12 | Fill #0

## 2018-08-10 MED FILL — PREVIDENT 5000 BOOSTER PLUS: 1.1 | 30 days supply | Qty: 100 | Fill #2

## 2018-08-11 MED FILL — METHOTREXATE 25 MG/ML VIAL: 50 | 90 days supply | Qty: 8 | Fill #0

## 2018-10-12 MED FILL — FOLIC ACID 1 MG TABS: 1 | 30 days supply | Qty: 30 | Fill #3

## 2018-10-12 MED FILL — COMBIGAN EYE DROPS: 0.2-0.5 | 30 days supply | Qty: 5 | Fill #3

## 2018-11-08 MED FILL — FOLIC ACID 1 MG TABS: 1 | 30 days supply | Qty: 30 | Fill #0

## 2018-12-07 MED FILL — COMBIGAN EYE DROPS: 0.2-0.5 | 30 days supply | Qty: 5 | Fill #4

## 2018-12-07 MED FILL — VIT D2 1.25 MG (50,000 UNIT: 1.25 MG | 84 days supply | Qty: 12 | Fill #1

## 2018-12-29 ENCOUNTER — Other Ambulatory Visit: Payer: Self-pay | Admitting: Family Medicine

## 2018-12-29 DIAGNOSIS — Z1231 Encounter for screening mammogram for malignant neoplasm of breast: Secondary | ICD-10-CM

## 2019-01-10 MED FILL — PREVIDENT 5000 BOOSTER PLUS: 1.1 | 30 days supply | Qty: 100 | Fill #3

## 2019-01-10 MED FILL — FOLIC ACID 1 MG TABS: 1 | 30 days supply | Qty: 30 | Fill #1

## 2019-01-25 ENCOUNTER — Ambulatory Visit
Admission: RE | Admit: 2019-01-25 | Discharge: 2019-01-25 | Disposition: A | Discharge status: Home / Self Care | Payer: BC Managed Care – PPO | Source: Ambulatory Visit | Attending: Family Medicine | Admitting: Family Medicine

## 2019-01-25 DIAGNOSIS — Z1231 Encounter for screening mammogram for malignant neoplasm of breast: Secondary | ICD-10-CM

## 2019-02-05 MED FILL — DORZOLAMIDE HCL 2% EYE DRP: 2 | 50 days supply | Qty: 10 | Fill #1

## 2019-02-05 MED FILL — predniSONE 5 MG TABS: 5 | 28 days supply | Qty: 70 | Fill #0

## 2019-02-05 MED FILL — FOLIC ACID 1 MG TABS: 1 | 30 days supply | Qty: 30 | Fill #2

## 2019-02-06 MED FILL — COMBIGAN EYE DROPS: 0.2-0.5 | 25 days supply | Qty: 5 | Fill #0

## 2019-02-06 MED FILL — FENOFIBRATE 160 MG TABLET: 160 | 30 days supply | Qty: 30 | Fill #0

## 2019-02-08 MED FILL — VIT D2 1.25 MG (50,000 UNIT: 1.25 MG | 84 days supply | Qty: 12 | Fill #2

## 2019-02-20 MED FILL — BD TB SYRINGE 27GX1/2: 27G X 1/2" | 90 days supply | Qty: 16 | Fill #0

## 2019-02-20 MED FILL — BD TB SYRINGE 27GX1/2": 27G X 1/2" | 90 days supply | Qty: 16 | Fill #0

## 2019-02-20 MED FILL — METHOTREXATE 25 MG/ML VIAL: 50 | 8 days supply | Qty: 8 | Fill #0

## 2019-04-19 MED FILL — DORZOLAMIDE HCL 2% EYE DRP: 2 | 50 days supply | Qty: 10 | Fill #2

## 2019-04-19 MED FILL — COMBIGAN EYE DROPS: 0.2-0.5 | 25 days supply | Qty: 5 | Fill #1

## 2019-04-23 MED FILL — FOLIC ACID 1 MG TABS: 1 | 30 days supply | Qty: 30 | Fill #0

## 2019-05-23 MED FILL — METHOTREXATE SODIUM 2.5 MG: 2.5 | 28 days supply | Qty: 24 | Fill #0

## 2019-05-30 MED FILL — FOLIC ACID 1 MG TABS: 1 | 30 days supply | Qty: 30 | Fill #1

## 2019-05-30 MED FILL — VIT D2 1.25 MG (50,000 UNIT: 1.25 MG | 84 days supply | Qty: 12 | Fill #0

## 2019-06-06 MED FILL — PREVIDENT 5000 BOOSTER PLUS: 1.1 | 30 days supply | Qty: 100 | Fill #0

## 2019-07-18 MED FILL — FOLIC ACID 1 MG TABS: 1 | 30 days supply | Qty: 30 | Fill #0

## 2019-07-18 MED FILL — BD TB SYRINGE 27GX1/2: 27G X 1/2" | 90 days supply | Qty: 13 | Fill #0

## 2019-07-18 MED FILL — METHOTREXATE 25 MG/ML VIAL: 50 | 90 days supply | Qty: 8 | Fill #0

## 2019-07-18 MED FILL — BD TB SYRINGE 27GX1/2": 27G X 1/2" | 90 days supply | Qty: 13 | Fill #0

## 2019-07-23 MED FILL — COMBIGAN EYE DROPS: 0.2-0.5 | 25 days supply | Qty: 5 | Fill #2

## 2019-07-27 MED FILL — FENOFIBRATE 160 MG TABLET: 160 | 90 days supply | Qty: 90 | Fill #0

## 2019-08-31 MED FILL — FOLIC ACID 1 MG TABS: 1 | 30 days supply | Qty: 30 | Fill #1

## 2019-09-24 MED FILL — COMBIGAN EYE DROPS: 0.2-0.5 | 25 days supply | Qty: 5 | Fill #3

## 2019-09-24 MED FILL — VIT D2 1.25 MG (50,000 UNIT: 1.25 MG | 84 days supply | Qty: 12 | Fill #1

## 2019-10-24 MED FILL — FOLIC ACID 1 MG TABS: 1 | 30 days supply | Qty: 30 | Fill #2

## 2019-12-28 MED FILL — BD TB SYRINGE 27GX1/2: 27G X 1/2" | 90 days supply | Qty: 13 | Fill #1

## 2019-12-28 MED FILL — COMBIGAN EYE DROPS: 0.2-0.5 | 25 days supply | Qty: 5 | Fill #4

## 2019-12-28 MED FILL — PREVIDENT 5000 BOOSTER PLUS: 1.1 | 30 days supply | Qty: 100 | Fill #1

## 2019-12-28 MED FILL — VIT D2 1.25 MG (50,000 UNIT: 1.25 MG | 84 days supply | Qty: 12 | Fill #0

## 2019-12-28 MED FILL — FOLIC ACID 1 MG TABS: 1 | 30 days supply | Qty: 30 | Fill #3

## 2020-01-02 MED FILL — DORZOLAMIDE HCL 2% EYE DRP: 2 | 38 days supply | Qty: 10 | Fill #0

## 2020-02-12 MED FILL — METHOTREXATE 25 MG/ML VIAL: 50 | 90 days supply | Qty: 8 | Fill #0

## 2020-02-16 ENCOUNTER — Ambulatory Visit: Payer: Self-pay | Attending: Internal Medicine

## 2020-02-16 DIAGNOSIS — Z23 Encounter for immunization: Secondary | ICD-10-CM

## 2020-02-16 NOTE — Progress Notes (Signed)
   Covid-19 Vaccination Clinic  Name:  Kimberly Sims    MRN: [id number] DOB: 09-17-1956  02/16/2020  Kimberly Sims was observed post Covid-19 immunization for 15 minutes without incident. She was provided with Vaccine Information Sheet and instruction to access the V-Safe system.   Kimberly Sims was instructed to call 911 with any severe reactions post vaccine: Marland Kitchen Difficulty breathing  . Swelling of face and throat  . A fast heartbeat  . A bad rash all over body  . Dizziness and weakness   Immunizations Administered    Name Date Dose VIS Date Route   Pfizer COVID-19 Vaccine 02/16/2020  9:49 AM 0.3 mL 11/02/2019 Intramuscular   Manufacturer: [employer], [employer]   Lot: [id number]   NDC: [address]-5

## 2020-02-27 MED FILL — FOLIC ACID 1 MG TABS: 1 | 30 days supply | Qty: 30 | Fill #4

## 2020-03-11 ENCOUNTER — Ambulatory Visit: Payer: Self-pay | Attending: Internal Medicine

## 2020-03-11 DIAGNOSIS — Z23 Encounter for immunization: Secondary | ICD-10-CM

## 2020-03-11 NOTE — Progress Notes (Signed)
   Covid-19 Vaccination Clinic  Name:  Kimberly Sims    MRN: [id number] DOB: 09/13/1956  03/11/2020  Kimberly Sims was observed post Covid-19 immunization for 15 minutes without incident. She was provided with Vaccine Information Sheet and instruction to access the V-Safe system.   Kimberly Sims was instructed to call 911 with any severe reactions post vaccine: Marland Kitchen Difficulty breathing  . Swelling of face and throat  . A fast heartbeat  . A bad rash all over body  . Dizziness and weakness   Immunizations Administered    Name Date Dose VIS Date Route   Pfizer COVID-19 Vaccine 03/11/2020 10:12 AM 0.3 mL 01/16/2019 Intramuscular   Manufacturer: [employer], [employer]   Lot: [id number]   NDC: [address]-5

## 2020-03-26 ENCOUNTER — Other Ambulatory Visit (HOSPITAL_COMMUNITY): Payer: Self-pay | Admitting: Ophthalmology

## 2020-03-26 MED FILL — COMBIGAN EYE DROPS: 0.2-0.5 | 25 days supply | Qty: 5 | Fill #0

## 2020-03-26 MED FILL — FOLIC ACID 1 MG TABS: 1 | 30 days supply | Qty: 30 | Fill #5

## 2020-05-14 ENCOUNTER — Other Ambulatory Visit: Payer: Self-pay | Admitting: Family Medicine

## 2020-05-14 DIAGNOSIS — Z1231 Encounter for screening mammogram for malignant neoplasm of breast: Secondary | ICD-10-CM

## 2020-05-20 ENCOUNTER — Other Ambulatory Visit (HOSPITAL_COMMUNITY): Payer: Self-pay | Admitting: Physician Assistant

## 2020-05-20 MED FILL — FOLIC ACID 1 MG TABS: 1 | 30 days supply | Qty: 30 | Fill #0

## 2020-05-20 MED FILL — COMBIGAN EYE DROPS: 0.2-0.5 | 25 days supply | Qty: 5 | Fill #1

## 2020-05-20 MED FILL — VIT D2 1.25 MG (50,000 UNIT: 1.25 MG | 84 days supply | Qty: 12 | Fill #1

## 2020-05-20 MED FILL — FENOFIBRATE 160 MG TABLET: 160 | 90 days supply | Qty: 90 | Fill #0

## 2020-05-21 ENCOUNTER — Ambulatory Visit
Admission: RE | Admit: 2020-05-21 | Discharge: 2020-05-21 | Disposition: A | Discharge status: Home / Self Care | Payer: BC Managed Care – PPO | Source: Ambulatory Visit | Attending: Family Medicine | Admitting: Family Medicine

## 2020-05-21 ENCOUNTER — Other Ambulatory Visit: Payer: Self-pay

## 2020-05-21 DIAGNOSIS — Z1231 Encounter for screening mammogram for malignant neoplasm of breast: Secondary | ICD-10-CM

## 2020-06-24 MED FILL — FOLIC ACID 1 MG TABS: 1 | 30 days supply | Qty: 30 | Fill #1

## 2020-06-24 MED FILL — BD TB SYRINGE 27GX1/2: 27G X 1/2" | 90 days supply | Qty: 13 | Fill #2

## 2020-06-26 MED FILL — METHOTREXATE 25 MG/ML VIAL: 50 | 90 days supply | Qty: 8 | Fill #0

## 2020-07-18 ENCOUNTER — Other Ambulatory Visit (HOSPITAL_COMMUNITY): Payer: Self-pay | Admitting: Family Medicine

## 2020-07-23 MED FILL — ESTRADIOL 0.1 MG/GM CREA: 0.1 | 90 days supply | Qty: 43 | Fill #0

## 2020-08-11 MED FILL — COMBIGAN EYE DROPS: 0.2-0.5 | 25 days supply | Qty: 5 | Fill #2

## 2020-08-11 MED FILL — VIT D2 1.25 MG (50,000 UNIT: 1.25 MG | 84 days supply | Qty: 12 | Fill #0

## 2020-08-11 MED FILL — PREVIDENT 5000 BOOSTER PLUS: 1.1 | 30 days supply | Qty: 100 | Fill #0

## 2020-08-11 MED FILL — DORZOLAMIDE HCL 2% EYE DRP: 2 | 38 days supply | Qty: 10 | Fill #1

## 2020-08-11 MED FILL — FOLIC ACID 1 MG TABS: 1 | 30 days supply | Qty: 30 | Fill #2

## 2020-08-12 MED FILL — TRAVOPROST (BAK FREE) 0.004: 0.004 | 25 days supply | Qty: 3 | Fill #0

## 2020-08-25 MED FILL — BD TB SYRINGE 27GX1/2: 27G X 1/2" | 90 days supply | Qty: 13 | Fill #0

## 2020-09-06 ENCOUNTER — Ambulatory Visit: Payer: BC Managed Care – PPO | Attending: Internal Medicine

## 2020-09-06 DIAGNOSIS — Z23 Encounter for immunization: Secondary | ICD-10-CM

## 2020-09-06 NOTE — Progress Notes (Signed)
   Covid-19 Vaccination Clinic  Name:  Apollonia Amini    MRN: [id number] DOB: 19-Jan-1956  09/06/2020  Ms. Speir was observed post Covid-19 immunization for 15 minutes without incident. She was provided with Vaccine Information Sheet and instruction to access the V-Safe system.   Ms. Baillargeon was instructed to call 911 with any severe reactions post vaccine: Marland Kitchen Difficulty breathing  . Swelling of face and throat  . A fast heartbeat  . A bad rash all over body  . Dizziness and weakness

## 2020-09-09 DIAGNOSIS — M0579 Rheumatoid arthritis with rheumatoid factor of multiple sites without organ or systems involvement: Secondary | ICD-10-CM | POA: Diagnosis not present

## 2020-09-09 DIAGNOSIS — M2550 Pain in unspecified joint: Secondary | ICD-10-CM | POA: Diagnosis not present

## 2020-09-09 DIAGNOSIS — R5382 Chronic fatigue, unspecified: Secondary | ICD-10-CM | POA: Diagnosis not present

## 2020-09-09 DIAGNOSIS — Z6821 Body mass index (BMI) 21.0-21.9, adult: Secondary | ICD-10-CM | POA: Diagnosis not present

## 2020-10-08 MED FILL — COMBIGAN EYE DROPS: 0.2-0.5 | 25 days supply | Qty: 5 | Fill #3

## 2020-10-08 MED FILL — FOLIC ACID 1 MG TABS: 1 | 30 days supply | Qty: 30 | Fill #3

## 2020-11-28 ENCOUNTER — Other Ambulatory Visit (HOSPITAL_COMMUNITY): Payer: Self-pay | Admitting: Physician Assistant

## 2020-11-28 MED FILL — BD TB SYRINGE 27GX1/2: 27G X 1/2" | 90 days supply | Qty: 13 | Fill #0

## 2020-12-08 MED FILL — VIT D2 1.25 MG (50,000 UNIT: 1.25 MG | 84 days supply | Qty: 12 | Fill #1

## 2020-12-08 MED FILL — FOLIC ACID 1 MG TABS: 1 | 30 days supply | Qty: 30 | Fill #4

## 2020-12-10 ENCOUNTER — Other Ambulatory Visit (HOSPITAL_COMMUNITY): Payer: Self-pay | Admitting: Physician Assistant

## 2020-12-10 DIAGNOSIS — H401132 Primary open-angle glaucoma, bilateral, moderate stage: Secondary | ICD-10-CM | POA: Diagnosis not present

## 2020-12-10 DIAGNOSIS — H25013 Cortical age-related cataract, bilateral: Secondary | ICD-10-CM | POA: Diagnosis not present

## 2020-12-10 DIAGNOSIS — M0579 Rheumatoid arthritis with rheumatoid factor of multiple sites without organ or systems involvement: Secondary | ICD-10-CM | POA: Diagnosis not present

## 2020-12-10 DIAGNOSIS — H2513 Age-related nuclear cataract, bilateral: Secondary | ICD-10-CM | POA: Diagnosis not present

## 2020-12-10 MED FILL — predniSONE 5 MG TABS: 5 | 28 days supply | Qty: 70 | Fill #0

## 2021-02-12 ENCOUNTER — Other Ambulatory Visit (HOSPITAL_COMMUNITY): Payer: Self-pay | Admitting: Physician Assistant

## 2021-02-12 MED FILL — COMBIGAN EYE DROPS: 0.2-0.5 | 19 days supply | Qty: 5 | Fill #0

## 2021-02-12 MED FILL — FOLIC ACID 1 MG TABS: 1 | 30 days supply | Qty: 30 | Fill #5

## 2021-02-17 MED FILL — METHOTREXATE 25 MG/ML VIAL: 50 | 47 days supply | Qty: 4 | Fill #0

## 2021-03-10 ENCOUNTER — Other Ambulatory Visit (HOSPITAL_COMMUNITY): Payer: Self-pay

## 2021-03-10 DIAGNOSIS — Z6822 Body mass index (BMI) 22.0-22.9, adult: Secondary | ICD-10-CM | POA: Diagnosis not present

## 2021-03-10 DIAGNOSIS — M0579 Rheumatoid arthritis with rheumatoid factor of multiple sites without organ or systems involvement: Secondary | ICD-10-CM | POA: Diagnosis not present

## 2021-03-10 DIAGNOSIS — R5382 Chronic fatigue, unspecified: Secondary | ICD-10-CM | POA: Diagnosis not present

## 2021-03-10 DIAGNOSIS — M2550 Pain in unspecified joint: Secondary | ICD-10-CM | POA: Diagnosis not present

## 2021-03-10 MED ORDER — METHOTREXATE SODIUM CHEMO INJECTION 50 MG/2ML
15.0000 mg | INTRAMUSCULAR | 1 refills | Status: AC
  Filled 2021-03-10: qty 50, 84d supply, fill #0
  Filled 2021-03-31 – 2021-06-09 (×2): qty 4, 47d supply, fill #0
  Filled 2021-09-14: qty 4, 47d supply, fill #1
  Filled 2022-01-27: qty 8, 90d supply, fill #1

## 2021-03-18 ENCOUNTER — Other Ambulatory Visit (HOSPITAL_COMMUNITY): Payer: Self-pay

## 2021-03-18 ENCOUNTER — Other Ambulatory Visit (HOSPITAL_COMMUNITY): Payer: Self-pay | Admitting: Physician Assistant

## 2021-03-18 MED FILL — Ergocalciferol Cap 1.25 MG (50000 Unit): ORAL | 91 days supply | Qty: 13 | Fill #0 | Status: AC

## 2021-03-20 ENCOUNTER — Other Ambulatory Visit (HOSPITAL_COMMUNITY): Payer: Self-pay

## 2021-03-25 ENCOUNTER — Other Ambulatory Visit (HOSPITAL_COMMUNITY): Payer: Self-pay

## 2021-03-30 ENCOUNTER — Other Ambulatory Visit (HOSPITAL_COMMUNITY): Payer: Self-pay

## 2021-03-31 ENCOUNTER — Other Ambulatory Visit (HOSPITAL_COMMUNITY): Payer: Self-pay

## 2021-03-31 MED ORDER — SODIUM FLUORIDE 1.1 % DT PSTE
PASTE | DENTAL | 99 refills | Status: AC
  Filled 2021-03-31 – 2021-06-09 (×2): qty 100, 30d supply, fill #0

## 2021-03-31 MED ORDER — FOLIC ACID 1 MG PO TABS
1.0000 mg | ORAL_TABLET | Freq: Every day | ORAL | 5 refills | Status: DC
  Filled 2021-03-31 – 2021-06-09 (×2): qty 30, 30d supply, fill #0
  Filled 2021-08-26: qty 30, 30d supply, fill #1
  Filled 2021-10-27: qty 30, 30d supply, fill #2
  Filled 2022-01-07: qty 30, 30d supply, fill #3
  Filled 2022-03-22: qty 30, 30d supply, fill #4

## 2021-03-31 MED ORDER — SODIUM FLUORIDE 5000 PPM 1.1 % DT PSTE
PASTE | DENTAL | 99 refills | Status: DC
  Filled 2021-03-31: qty 100, 30d supply, fill #0

## 2021-03-31 MED FILL — Tuberculin/Allergy Syringe/Needle (Disp) 1 ML 27 x 1/2": 90 days supply | Qty: 13 | Fill #0 | Status: AC

## 2021-04-02 ENCOUNTER — Other Ambulatory Visit (HOSPITAL_COMMUNITY): Payer: Self-pay

## 2021-04-03 ENCOUNTER — Other Ambulatory Visit (HOSPITAL_COMMUNITY): Payer: Self-pay

## 2021-04-10 ENCOUNTER — Other Ambulatory Visit (HOSPITAL_COMMUNITY): Payer: Self-pay

## 2021-04-10 MED ORDER — "TUBERCULIN SYRINGE 26G X 3/8"" 1 ML MISC"
3 refills | Status: AC
  Filled 2021-04-10: qty 13, 91d supply, fill #0

## 2021-05-14 ENCOUNTER — Other Ambulatory Visit (HOSPITAL_COMMUNITY): Payer: Self-pay

## 2021-05-19 ENCOUNTER — Other Ambulatory Visit (HOSPITAL_COMMUNITY): Payer: Self-pay | Admitting: Physician Assistant

## 2021-05-19 MED FILL — Brimonidine Tartrate-Timolol Maleate Ophth Soln 0.2-0.5%: OPHTHALMIC | 19 days supply | Qty: 5 | Fill #0 | Status: AC

## 2021-05-20 ENCOUNTER — Other Ambulatory Visit (HOSPITAL_COMMUNITY): Payer: Self-pay

## 2021-05-22 ENCOUNTER — Other Ambulatory Visit (HOSPITAL_COMMUNITY): Payer: Self-pay

## 2021-06-03 ENCOUNTER — Other Ambulatory Visit: Payer: Self-pay | Admitting: Family Medicine

## 2021-06-03 DIAGNOSIS — Z1231 Encounter for screening mammogram for malignant neoplasm of breast: Secondary | ICD-10-CM

## 2021-06-04 ENCOUNTER — Ambulatory Visit: Payer: BC Managed Care – PPO

## 2021-06-09 ENCOUNTER — Inpatient Hospital Stay: Admission: RE | Admit: 2021-06-09 | Payer: BC Managed Care – PPO | Source: Ambulatory Visit

## 2021-06-09 ENCOUNTER — Ambulatory Visit: Payer: BC Managed Care – PPO | Attending: Internal Medicine

## 2021-06-09 ENCOUNTER — Other Ambulatory Visit (HOSPITAL_BASED_OUTPATIENT_CLINIC_OR_DEPARTMENT_OTHER): Payer: Self-pay

## 2021-06-09 ENCOUNTER — Other Ambulatory Visit: Payer: Self-pay

## 2021-06-09 DIAGNOSIS — Z23 Encounter for immunization: Secondary | ICD-10-CM

## 2021-06-09 DIAGNOSIS — M0579 Rheumatoid arthritis with rheumatoid factor of multiple sites without organ or systems involvement: Secondary | ICD-10-CM | POA: Diagnosis not present

## 2021-06-09 MED ORDER — PFIZER-BIONT COVID-19 VAC-TRIS 30 MCG/0.3ML IM SUSP
INTRAMUSCULAR | 0 refills | Status: AC
  Filled 2021-06-09: qty 0.3, 1d supply, fill #0

## 2021-06-09 MED FILL — Brimonidine Tartrate-Timolol Maleate Ophth Soln 0.2-0.5%: OPHTHALMIC | 19 days supply | Qty: 5 | Fill #0 | Status: CN

## 2021-06-09 MED FILL — Tuberculin/Allergy Syringe/Needle (Disp) 1 ML 27 x 1/2": 90 days supply | Qty: 13 | Fill #0 | Status: AC

## 2021-06-09 MED FILL — Tuberculin/Allergy Syringe/Needle (Disp) 1 ML 27 x 1/2": 90 days supply | Qty: 13 | Fill #0 | Status: CN

## 2021-06-09 MED FILL — Ergocalciferol Cap 1.25 MG (50000 Unit): ORAL | 91 days supply | Qty: 13 | Fill #0 | Status: AC

## 2021-06-09 NOTE — Progress Notes (Signed)
   Covid-19 Vaccination Clinic  Name:  Tory Mckissack    MRN: [id number] DOB: Oct 08, 1956  06/09/2021  Ms. Wetherell was observed post Covid-19 immunization for 15 minutes without incident. She was provided with Vaccine Information Sheet and instruction to access the V-Safe system.   Ms. Ganesh was instructed to call 911 with any severe reactions post vaccine: Difficulty breathing  Swelling of face and throat  A fast heartbeat  A bad rash all over body  Dizziness and weakness   Immunizations Administered     Name Date Dose VIS Date Route   PFIZER Comrnaty(Gray TOP) Covid-19 Vaccine 06/09/2021 10:21 AM 0.3 mL 10/30/2020 Intramuscular   Manufacturer: [employer], [employer]   Lot: [id number]   NDC: [phone]

## 2021-06-10 ENCOUNTER — Other Ambulatory Visit (HOSPITAL_BASED_OUTPATIENT_CLINIC_OR_DEPARTMENT_OTHER): Payer: Self-pay

## 2021-06-19 DIAGNOSIS — H401132 Primary open-angle glaucoma, bilateral, moderate stage: Secondary | ICD-10-CM | POA: Diagnosis not present

## 2021-06-29 ENCOUNTER — Ambulatory Visit: Payer: BC Managed Care – PPO

## 2021-07-20 ENCOUNTER — Other Ambulatory Visit (HOSPITAL_COMMUNITY): Payer: Self-pay

## 2021-07-20 DIAGNOSIS — Z23 Encounter for immunization: Secondary | ICD-10-CM | POA: Diagnosis not present

## 2021-07-20 DIAGNOSIS — H409 Unspecified glaucoma: Secondary | ICD-10-CM | POA: Diagnosis not present

## 2021-07-20 DIAGNOSIS — N898 Other specified noninflammatory disorders of vagina: Secondary | ICD-10-CM | POA: Diagnosis not present

## 2021-07-20 DIAGNOSIS — Z1211 Encounter for screening for malignant neoplasm of colon: Secondary | ICD-10-CM | POA: Diagnosis not present

## 2021-07-20 DIAGNOSIS — Z0000 Encounter for general adult medical examination without abnormal findings: Secondary | ICD-10-CM | POA: Diagnosis not present

## 2021-07-20 DIAGNOSIS — Z889 Allergy status to unspecified drugs, medicaments and biological substances status: Secondary | ICD-10-CM | POA: Diagnosis not present

## 2021-07-20 DIAGNOSIS — E7800 Pure hypercholesterolemia, unspecified: Secondary | ICD-10-CM | POA: Diagnosis not present

## 2021-07-20 DIAGNOSIS — E559 Vitamin D deficiency, unspecified: Secondary | ICD-10-CM | POA: Diagnosis not present

## 2021-07-20 DIAGNOSIS — M069 Rheumatoid arthritis, unspecified: Secondary | ICD-10-CM | POA: Diagnosis not present

## 2021-07-20 DIAGNOSIS — R7303 Prediabetes: Secondary | ICD-10-CM | POA: Diagnosis not present

## 2021-07-20 MED ORDER — FENOFIBRATE 160 MG PO TABS
80.0000 mg | ORAL_TABLET | ORAL | 3 refills | Status: AC
  Filled 2021-07-20: qty 20, 90d supply, fill #0

## 2021-07-20 MED ORDER — VITAMIN D (ERGOCALCIFEROL) 1.25 MG (50000 UNIT) PO CAPS
50000.0000 [IU] | ORAL_CAPSULE | ORAL | 3 refills | Status: DC
  Filled 2021-07-20: qty 12, 84d supply, fill #0
  Filled 2021-09-14: qty 13, 91d supply, fill #0
  Filled 2021-09-15: qty 13, 90d supply, fill #0
  Filled 2022-01-07: qty 13, 90d supply, fill #1
  Filled 2022-06-10: qty 13, 90d supply, fill #2

## 2021-07-20 MED ORDER — ESTRADIOL 0.1 MG/GM VA CREA
TOPICAL_CREAM | VAGINAL | 1 refills | Status: AC
  Filled 2021-07-20: qty 42.5, 90d supply, fill #0

## 2021-07-28 ENCOUNTER — Other Ambulatory Visit (HOSPITAL_COMMUNITY): Payer: Self-pay

## 2021-08-17 ENCOUNTER — Other Ambulatory Visit (HOSPITAL_BASED_OUTPATIENT_CLINIC_OR_DEPARTMENT_OTHER): Payer: Self-pay

## 2021-08-26 ENCOUNTER — Other Ambulatory Visit (HOSPITAL_BASED_OUTPATIENT_CLINIC_OR_DEPARTMENT_OTHER): Payer: Self-pay

## 2021-08-28 ENCOUNTER — Other Ambulatory Visit (HOSPITAL_BASED_OUTPATIENT_CLINIC_OR_DEPARTMENT_OTHER): Payer: Self-pay

## 2021-08-31 DIAGNOSIS — H401132 Primary open-angle glaucoma, bilateral, moderate stage: Secondary | ICD-10-CM | POA: Diagnosis not present

## 2021-09-03 ENCOUNTER — Ambulatory Visit: Payer: BC Managed Care – PPO | Attending: Internal Medicine

## 2021-09-03 ENCOUNTER — Other Ambulatory Visit (HOSPITAL_BASED_OUTPATIENT_CLINIC_OR_DEPARTMENT_OTHER): Payer: Self-pay

## 2021-09-03 DIAGNOSIS — Z23 Encounter for immunization: Secondary | ICD-10-CM

## 2021-09-03 MED ORDER — PFIZER COVID-19 VAC BIVALENT 30 MCG/0.3ML IM SUSP
INTRAMUSCULAR | 0 refills | Status: AC
  Filled 2021-09-03: qty 0.3, 1d supply, fill #0

## 2021-09-03 NOTE — Progress Notes (Signed)
   Covid-19 Vaccination Clinic  Name:  Erricka Falkner    MRN: [id number] DOB: 21-Aug-1956  09/03/2021  Ms. Revelle was observed post Covid-19 immunization for 15 minutes without incident. She was provided with Vaccine Information Sheet and instruction to access the V-Safe system.   Ms. Soltau was instructed to call 911 with any severe reactions post vaccine: Difficulty breathing  Swelling of face and throat  A fast heartbeat  A bad rash all over body  Dizziness and weakness

## 2021-09-09 ENCOUNTER — Other Ambulatory Visit (HOSPITAL_BASED_OUTPATIENT_CLINIC_OR_DEPARTMENT_OTHER): Payer: Self-pay

## 2021-09-09 DIAGNOSIS — M2550 Pain in unspecified joint: Secondary | ICD-10-CM | POA: Diagnosis not present

## 2021-09-09 DIAGNOSIS — Z6820 Body mass index (BMI) 20.0-20.9, adult: Secondary | ICD-10-CM | POA: Diagnosis not present

## 2021-09-09 DIAGNOSIS — M0579 Rheumatoid arthritis with rheumatoid factor of multiple sites without organ or systems involvement: Secondary | ICD-10-CM | POA: Diagnosis not present

## 2021-09-09 DIAGNOSIS — R5382 Chronic fatigue, unspecified: Secondary | ICD-10-CM | POA: Diagnosis not present

## 2021-09-09 MED ORDER — METHOTREXATE SODIUM CHEMO INJECTION 50 MG/2ML
INTRAMUSCULAR | 1 refills | Status: AC
  Filled 2021-09-09: qty 6, 70d supply, fill #0

## 2021-09-14 ENCOUNTER — Other Ambulatory Visit (HOSPITAL_COMMUNITY): Payer: Self-pay

## 2021-09-14 ENCOUNTER — Other Ambulatory Visit (HOSPITAL_BASED_OUTPATIENT_CLINIC_OR_DEPARTMENT_OTHER): Payer: Self-pay

## 2021-09-14 ENCOUNTER — Ambulatory Visit: Payer: BC Managed Care – PPO

## 2021-09-14 MED ORDER — DORZOLAMIDE HCL 2 % OP SOLN
OPHTHALMIC | 99 refills | Status: AC
  Filled 2021-09-14: qty 20, 90d supply, fill #0

## 2021-09-15 ENCOUNTER — Other Ambulatory Visit (HOSPITAL_BASED_OUTPATIENT_CLINIC_OR_DEPARTMENT_OTHER): Payer: Self-pay

## 2021-09-15 ENCOUNTER — Other Ambulatory Visit (HOSPITAL_COMMUNITY): Payer: Self-pay

## 2021-09-26 DIAGNOSIS — Z23 Encounter for immunization: Secondary | ICD-10-CM | POA: Diagnosis not present

## 2021-09-29 ENCOUNTER — Other Ambulatory Visit (HOSPITAL_BASED_OUTPATIENT_CLINIC_OR_DEPARTMENT_OTHER): Payer: Self-pay

## 2021-10-06 ENCOUNTER — Other Ambulatory Visit (HOSPITAL_BASED_OUTPATIENT_CLINIC_OR_DEPARTMENT_OTHER): Payer: Self-pay

## 2021-10-06 MED ORDER — SODIUM FLUORIDE 1.1 % DT PSTE
PASTE | DENTAL | 3 refills | Status: AC
  Filled 2021-10-06: qty 100, 30d supply, fill #0
  Filled 2022-01-27: qty 100, 30d supply, fill #1
  Filled 2022-07-05: qty 100, 30d supply, fill #2

## 2021-10-06 MED FILL — Brimonidine Tartrate-Timolol Maleate Ophth Soln 0.2-0.5%: OPHTHALMIC | 19 days supply | Qty: 5 | Fill #0 | Status: AC

## 2021-10-07 ENCOUNTER — Other Ambulatory Visit (HOSPITAL_BASED_OUTPATIENT_CLINIC_OR_DEPARTMENT_OTHER): Payer: Self-pay

## 2021-10-27 ENCOUNTER — Other Ambulatory Visit (HOSPITAL_BASED_OUTPATIENT_CLINIC_OR_DEPARTMENT_OTHER): Payer: Self-pay

## 2021-10-27 MED FILL — Tuberculin/Allergy Syringe/Needle (Disp) 1 ML 27 x 1/2": 90 days supply | Qty: 13 | Fill #1 | Status: AC

## 2021-12-03 ENCOUNTER — Ambulatory Visit
Admission: RE | Admit: 2021-12-03 | Discharge: 2021-12-03 | Disposition: A | Discharge status: Home / Self Care | Payer: PPO | Source: Ambulatory Visit | Attending: Family Medicine | Admitting: Family Medicine

## 2021-12-03 ENCOUNTER — Ambulatory Visit: Payer: BC Managed Care – PPO

## 2021-12-03 DIAGNOSIS — Z1231 Encounter for screening mammogram for malignant neoplasm of breast: Secondary | ICD-10-CM

## 2021-12-10 DIAGNOSIS — M0579 Rheumatoid arthritis with rheumatoid factor of multiple sites without organ or systems involvement: Secondary | ICD-10-CM | POA: Diagnosis not present

## 2021-12-24 DIAGNOSIS — H401132 Primary open-angle glaucoma, bilateral, moderate stage: Secondary | ICD-10-CM | POA: Diagnosis not present

## 2021-12-24 DIAGNOSIS — H25013 Cortical age-related cataract, bilateral: Secondary | ICD-10-CM | POA: Diagnosis not present

## 2021-12-24 DIAGNOSIS — H2513 Age-related nuclear cataract, bilateral: Secondary | ICD-10-CM | POA: Diagnosis not present

## 2022-01-07 ENCOUNTER — Other Ambulatory Visit (HOSPITAL_BASED_OUTPATIENT_CLINIC_OR_DEPARTMENT_OTHER): Payer: Self-pay

## 2022-01-07 MED FILL — Brimonidine Tartrate-Timolol Maleate Ophth Soln 0.2-0.5%: OPHTHALMIC | 19 days supply | Qty: 5 | Fill #1 | Status: AC

## 2022-01-27 ENCOUNTER — Other Ambulatory Visit (HOSPITAL_BASED_OUTPATIENT_CLINIC_OR_DEPARTMENT_OTHER): Payer: Self-pay

## 2022-01-27 MED ORDER — DIFLUPREDNATE 0.05 % OP EMUL
1.0000 [drp] | Freq: Three times a day (TID) | OPHTHALMIC | 1 refills | Status: AC
  Filled 2022-01-27: qty 5, 7d supply, fill #0

## 2022-01-27 MED ORDER — MOXIFLOXACIN HCL 0.5 % OP SOLN
1.0000 [drp] | Freq: Four times a day (QID) | OPHTHALMIC | 1 refills | Status: AC
  Filled 2022-01-27: qty 3, 7d supply, fill #0

## 2022-01-28 ENCOUNTER — Other Ambulatory Visit (HOSPITAL_BASED_OUTPATIENT_CLINIC_OR_DEPARTMENT_OTHER): Payer: Self-pay

## 2022-02-02 ENCOUNTER — Other Ambulatory Visit (HOSPITAL_BASED_OUTPATIENT_CLINIC_OR_DEPARTMENT_OTHER): Payer: Self-pay

## 2022-02-02 DIAGNOSIS — H2512 Age-related nuclear cataract, left eye: Secondary | ICD-10-CM | POA: Diagnosis not present

## 2022-02-02 DIAGNOSIS — H25042 Posterior subcapsular polar age-related cataract, left eye: Secondary | ICD-10-CM | POA: Diagnosis not present

## 2022-02-02 DIAGNOSIS — H25812 Combined forms of age-related cataract, left eye: Secondary | ICD-10-CM | POA: Diagnosis not present

## 2022-02-02 DIAGNOSIS — H25012 Cortical age-related cataract, left eye: Secondary | ICD-10-CM | POA: Diagnosis not present

## 2022-02-03 ENCOUNTER — Encounter (HOSPITAL_BASED_OUTPATIENT_CLINIC_OR_DEPARTMENT_OTHER): Payer: Self-pay | Admitting: Pharmacist

## 2022-02-03 ENCOUNTER — Other Ambulatory Visit (HOSPITAL_BASED_OUTPATIENT_CLINIC_OR_DEPARTMENT_OTHER): Payer: Self-pay

## 2022-02-03 MED ORDER — TRAVOPROST (BAK FREE) 0.004 % OP SOLN
OPHTHALMIC | 99 refills | Status: AC
  Filled 2022-02-03: qty 7.5, 75d supply, fill #0

## 2022-02-04 ENCOUNTER — Other Ambulatory Visit (HOSPITAL_BASED_OUTPATIENT_CLINIC_OR_DEPARTMENT_OTHER): Payer: Self-pay

## 2022-02-11 ENCOUNTER — Other Ambulatory Visit (HOSPITAL_BASED_OUTPATIENT_CLINIC_OR_DEPARTMENT_OTHER): Payer: Self-pay

## 2022-02-11 ENCOUNTER — Other Ambulatory Visit (HOSPITAL_COMMUNITY): Payer: Self-pay

## 2022-02-11 MED ORDER — BRIMONIDINE TARTRATE-TIMOLOL 0.2-0.5 % OP SOLN
1.0000 [drp] | Freq: Two times a day (BID) | OPHTHALMIC | 4 refills | Status: DC
  Filled 2022-02-11: qty 10, 50d supply, fill #0
  Filled 2022-02-12 – 2022-03-23 (×2): qty 5, 25d supply, fill #0
  Filled 2022-04-23: qty 5, 25d supply, fill #1
  Filled 2022-06-10: qty 5, 25d supply, fill #2
  Filled 2022-07-23: qty 5, 25d supply, fill #3
  Filled 2022-08-27: qty 5, 25d supply, fill #4
  Filled 2022-10-06: qty 5, 25d supply, fill #5
  Filled 2022-11-11: qty 5, 25d supply, fill #6
  Filled 2022-12-07: qty 15, 75d supply, fill #6
  Filled 2022-12-09: qty 15, 150d supply, fill #6

## 2022-02-12 ENCOUNTER — Other Ambulatory Visit (HOSPITAL_BASED_OUTPATIENT_CLINIC_OR_DEPARTMENT_OTHER): Payer: Self-pay

## 2022-02-12 ENCOUNTER — Other Ambulatory Visit (HOSPITAL_COMMUNITY): Payer: Self-pay

## 2022-02-15 ENCOUNTER — Other Ambulatory Visit (HOSPITAL_BASED_OUTPATIENT_CLINIC_OR_DEPARTMENT_OTHER): Payer: Self-pay

## 2022-02-15 MED ORDER — BRIMONIDINE TARTRATE-TIMOLOL 0.2-0.5 % OP SOLN
OPHTHALMIC | 3 refills | Status: AC
  Filled 2022-02-15: qty 15, 90d supply, fill #0

## 2022-03-08 DIAGNOSIS — R5382 Chronic fatigue, unspecified: Secondary | ICD-10-CM | POA: Diagnosis not present

## 2022-03-08 DIAGNOSIS — M0579 Rheumatoid arthritis with rheumatoid factor of multiple sites without organ or systems involvement: Secondary | ICD-10-CM | POA: Diagnosis not present

## 2022-03-08 DIAGNOSIS — Z6821 Body mass index (BMI) 21.0-21.9, adult: Secondary | ICD-10-CM | POA: Diagnosis not present

## 2022-03-22 ENCOUNTER — Other Ambulatory Visit (HOSPITAL_BASED_OUTPATIENT_CLINIC_OR_DEPARTMENT_OTHER): Payer: Self-pay

## 2022-03-23 ENCOUNTER — Other Ambulatory Visit (HOSPITAL_BASED_OUTPATIENT_CLINIC_OR_DEPARTMENT_OTHER): Payer: Self-pay

## 2022-03-23 ENCOUNTER — Other Ambulatory Visit (HOSPITAL_COMMUNITY): Payer: Self-pay

## 2022-03-23 MED ORDER — BRIMONIDINE TARTRATE-TIMOLOL 0.2-0.5 % OP SOLN
1.0000 [drp] | Freq: Two times a day (BID) | OPHTHALMIC | 4 refills | Status: AC
  Filled 2022-03-23: qty 15, 150d supply, fill #0
  Filled 2022-12-07: qty 20, 100d supply, fill #0

## 2022-03-24 ENCOUNTER — Other Ambulatory Visit (HOSPITAL_BASED_OUTPATIENT_CLINIC_OR_DEPARTMENT_OTHER): Payer: Self-pay

## 2022-03-24 MED ORDER — DIFLUPREDNATE 0.05 % OP EMUL
OPHTHALMIC | 1 refills | Status: AC
  Filled 2022-03-24: qty 5, 28d supply, fill #0

## 2022-03-24 MED ORDER — MOXIFLOXACIN HCL 0.5 % OP SOLN
OPHTHALMIC | 1 refills | Status: AC
  Filled 2022-03-24: qty 3, 3d supply, fill #0

## 2022-03-25 ENCOUNTER — Other Ambulatory Visit (HOSPITAL_BASED_OUTPATIENT_CLINIC_OR_DEPARTMENT_OTHER): Payer: Self-pay

## 2022-03-26 ENCOUNTER — Other Ambulatory Visit (HOSPITAL_BASED_OUTPATIENT_CLINIC_OR_DEPARTMENT_OTHER): Payer: Self-pay

## 2022-03-29 ENCOUNTER — Other Ambulatory Visit (HOSPITAL_BASED_OUTPATIENT_CLINIC_OR_DEPARTMENT_OTHER): Payer: Self-pay

## 2022-03-30 ENCOUNTER — Other Ambulatory Visit (HOSPITAL_BASED_OUTPATIENT_CLINIC_OR_DEPARTMENT_OTHER): Payer: Self-pay

## 2022-03-30 DIAGNOSIS — H25011 Cortical age-related cataract, right eye: Secondary | ICD-10-CM | POA: Diagnosis not present

## 2022-03-30 DIAGNOSIS — H2511 Age-related nuclear cataract, right eye: Secondary | ICD-10-CM | POA: Diagnosis not present

## 2022-03-30 DIAGNOSIS — H25811 Combined forms of age-related cataract, right eye: Secondary | ICD-10-CM | POA: Diagnosis not present

## 2022-03-30 MED ORDER — "BD TB SYRINGE 27G X 1/2"" 1 ML MISC"
3 refills | Status: AC
  Filled 2022-03-30: qty 13, 90d supply, fill #0
  Filled 2022-04-09: qty 13, 84d supply, fill #0
  Filled 2022-09-28: qty 13, 84d supply, fill #1

## 2022-04-07 ENCOUNTER — Other Ambulatory Visit (HOSPITAL_BASED_OUTPATIENT_CLINIC_OR_DEPARTMENT_OTHER): Payer: Self-pay

## 2022-04-09 ENCOUNTER — Other Ambulatory Visit (HOSPITAL_BASED_OUTPATIENT_CLINIC_OR_DEPARTMENT_OTHER): Payer: Self-pay

## 2022-04-23 ENCOUNTER — Other Ambulatory Visit (HOSPITAL_BASED_OUTPATIENT_CLINIC_OR_DEPARTMENT_OTHER): Payer: Self-pay

## 2022-04-26 ENCOUNTER — Other Ambulatory Visit (HOSPITAL_BASED_OUTPATIENT_CLINIC_OR_DEPARTMENT_OTHER): Payer: Self-pay

## 2022-04-26 MED ORDER — PEG 3350-KCL-NA BICARB-NACL 420 G PO SOLR
ORAL | 0 refills | Status: AC
  Filled 2022-04-26: qty 4000, 1d supply, fill #0

## 2022-05-06 DIAGNOSIS — Z800 Family history of malignant neoplasm of digestive organs: Secondary | ICD-10-CM | POA: Diagnosis not present

## 2022-05-06 DIAGNOSIS — Z1211 Encounter for screening for malignant neoplasm of colon: Secondary | ICD-10-CM | POA: Diagnosis not present

## 2022-06-07 DIAGNOSIS — M0579 Rheumatoid arthritis with rheumatoid factor of multiple sites without organ or systems involvement: Secondary | ICD-10-CM | POA: Diagnosis not present

## 2022-06-10 ENCOUNTER — Other Ambulatory Visit (HOSPITAL_BASED_OUTPATIENT_CLINIC_OR_DEPARTMENT_OTHER): Payer: Self-pay

## 2022-06-10 MED ORDER — FOLIC ACID 1 MG PO TABS
ORAL_TABLET | ORAL | 5 refills | Status: AC
  Filled 2022-06-10: qty 30, 30d supply, fill #0
  Filled 2022-09-14: qty 30, 30d supply, fill #1
  Filled 2022-12-07 (×2): qty 30, 30d supply, fill #2
  Filled 2023-02-01: qty 30, 30d supply, fill #3
  Filled 2023-03-15: qty 30, 30d supply, fill #4

## 2022-06-11 ENCOUNTER — Other Ambulatory Visit (HOSPITAL_BASED_OUTPATIENT_CLINIC_OR_DEPARTMENT_OTHER): Payer: Self-pay

## 2022-06-18 IMAGING — MG MM DIGITAL SCREENING BILAT W/ TOMO AND CAD
8 series · 9 of 24 positions shown · non-contrast
Comparison: Previous exam(s).

CLINICAL DATA: Screening.

EXAM:
DIGITAL SCREENING BILATERAL MAMMOGRAM WITH TOMOSYNTHESIS AND CAD
TECHNIQUE: Bilateral screening digital craniocaudal and mediolateral oblique
mammograms were obtained. Bilateral screening digital breast
tomosynthesis was performed. The images were evaluated with
computer-aided detection.

[L MLO synth-2D]
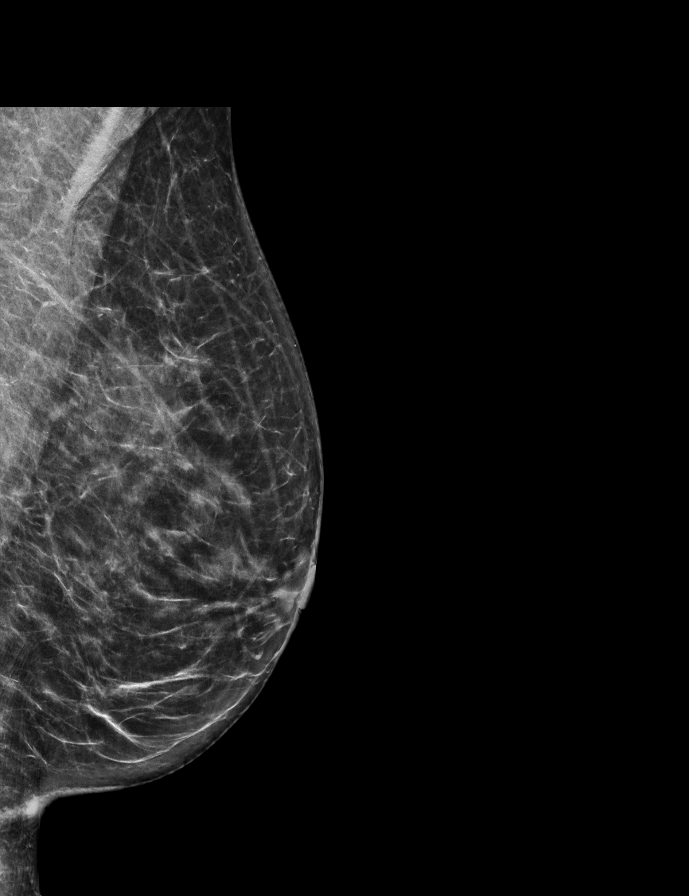

[R CC synth-2D]
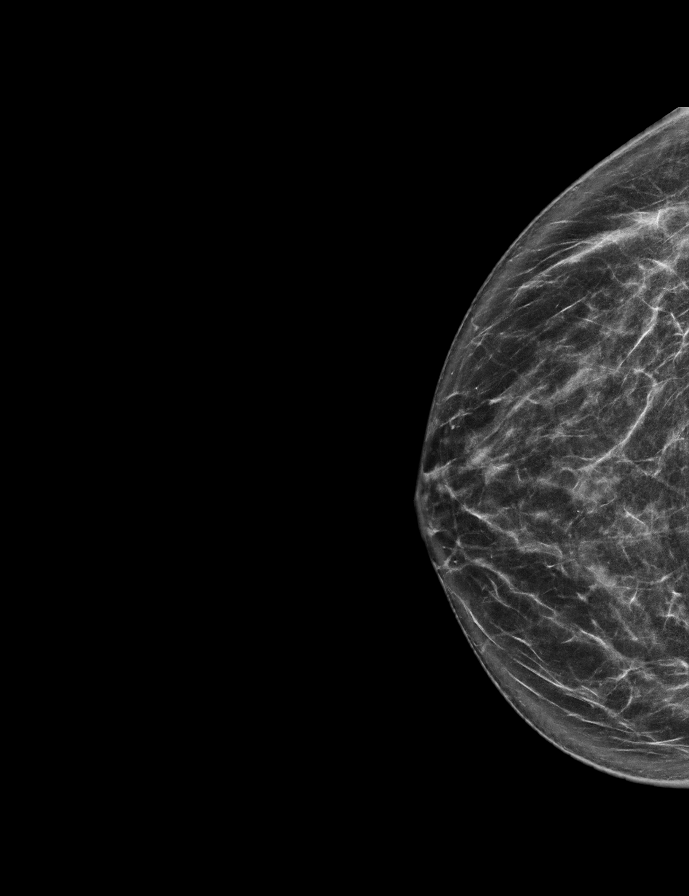

[R MLO synth-2D]
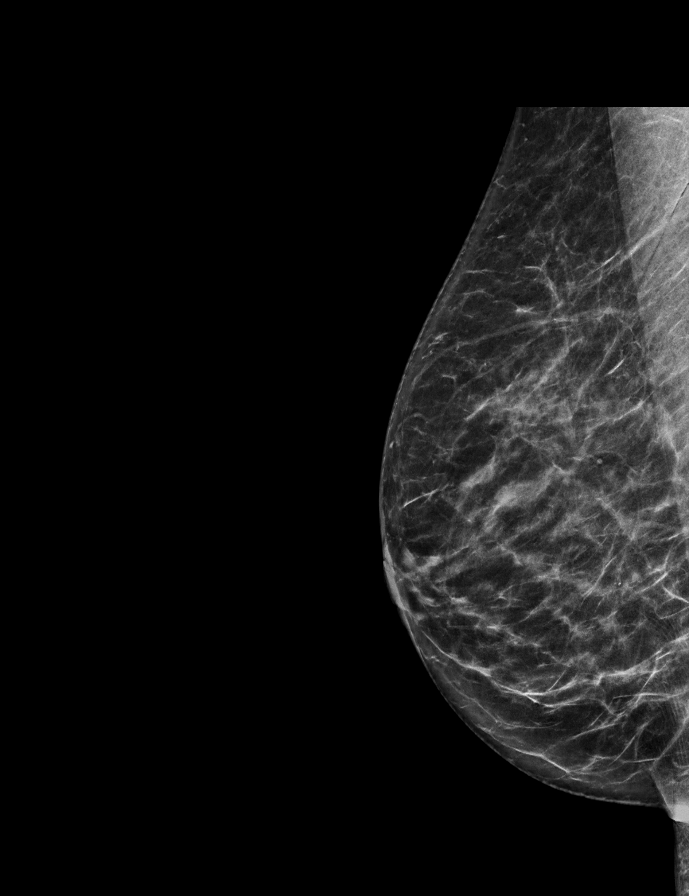

[L CC synth-2D]
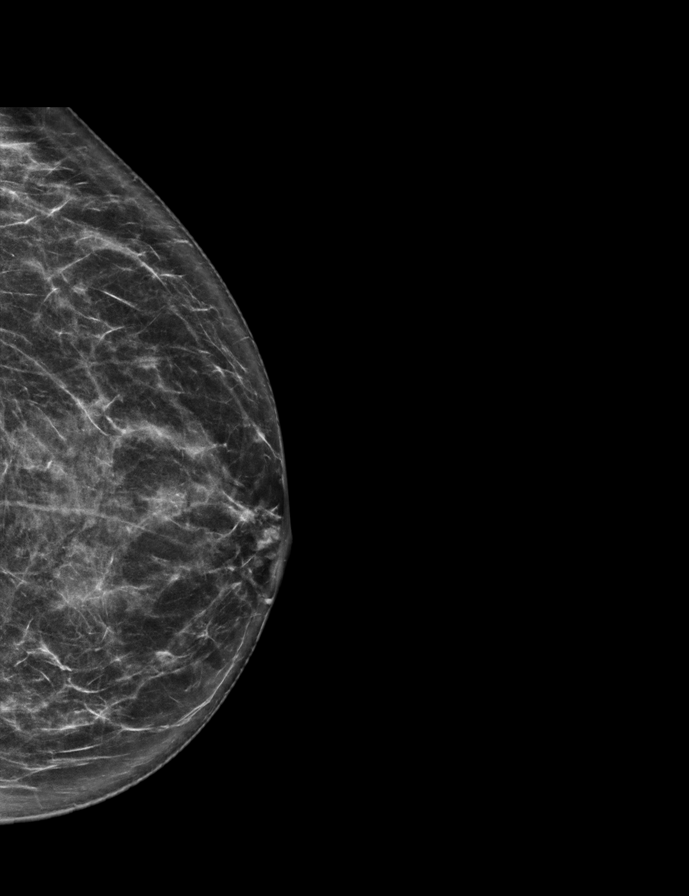

[L CC tomo · 2 of 64 frames shown]
[frame 21/64]
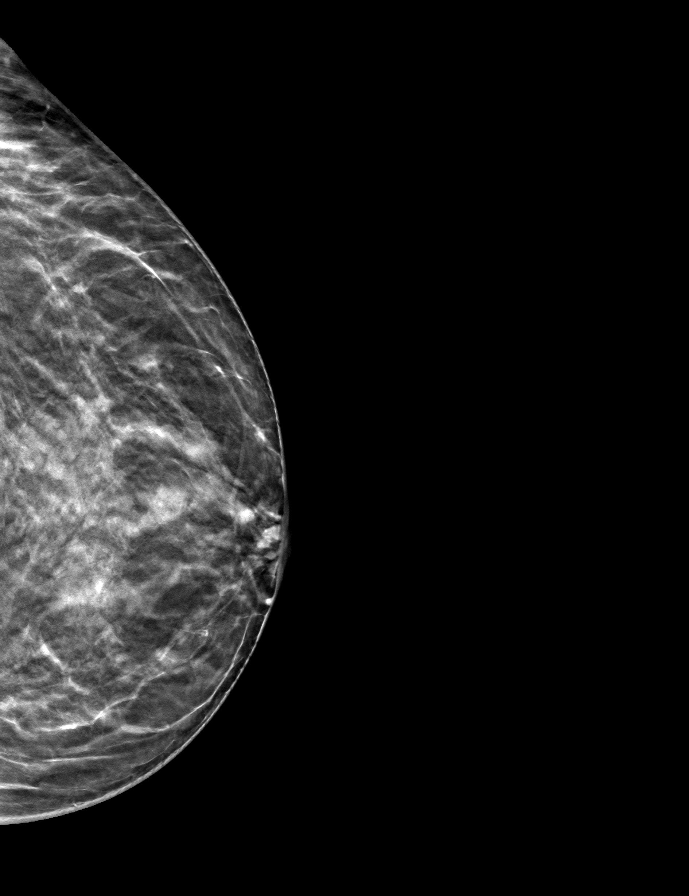
[frame 33/64]
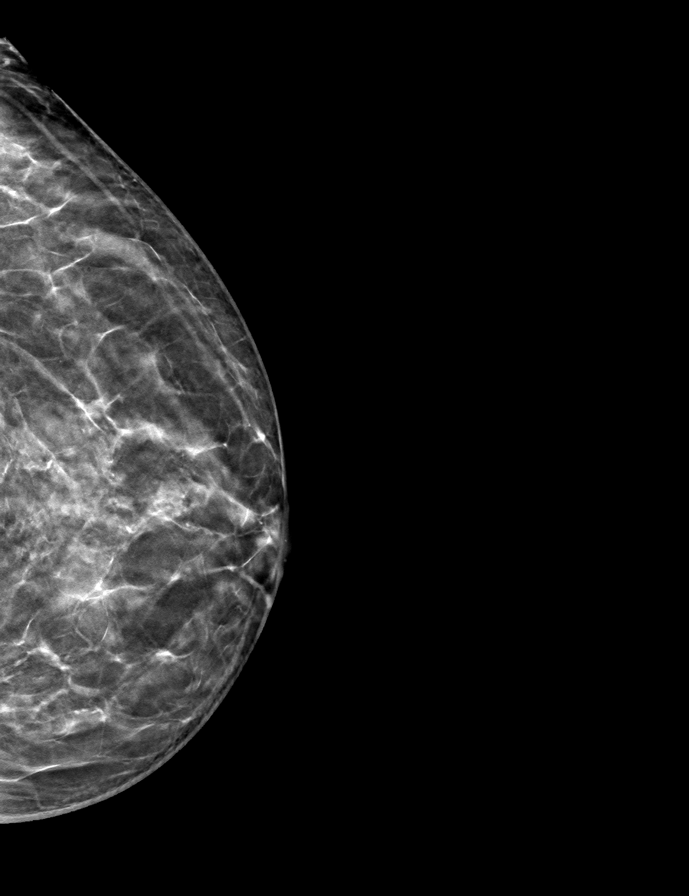

[L MLO tomo · tomo slice 34/67.0]
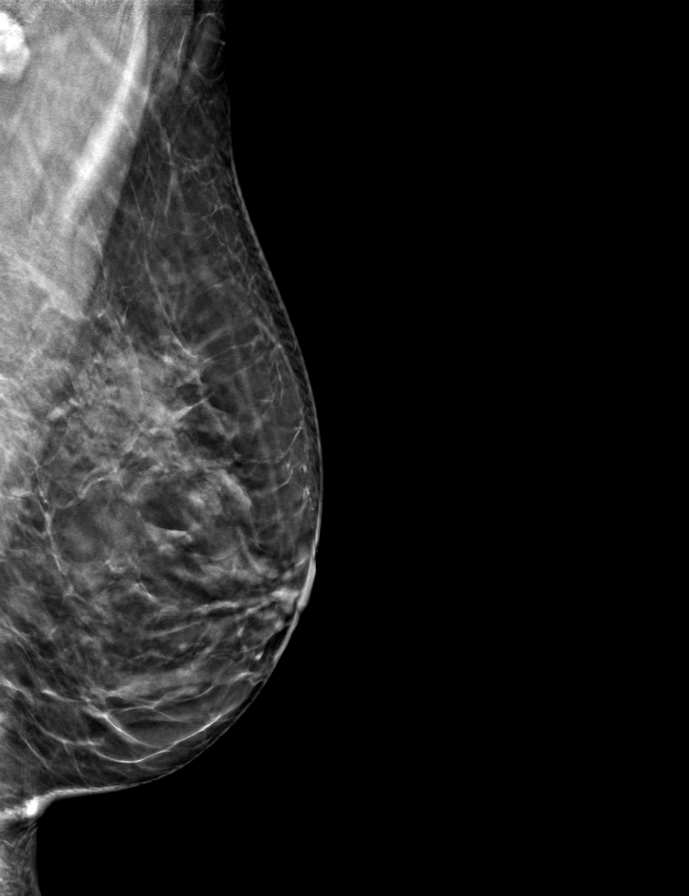

[R MLO tomo · tomo slice 32/63.0]
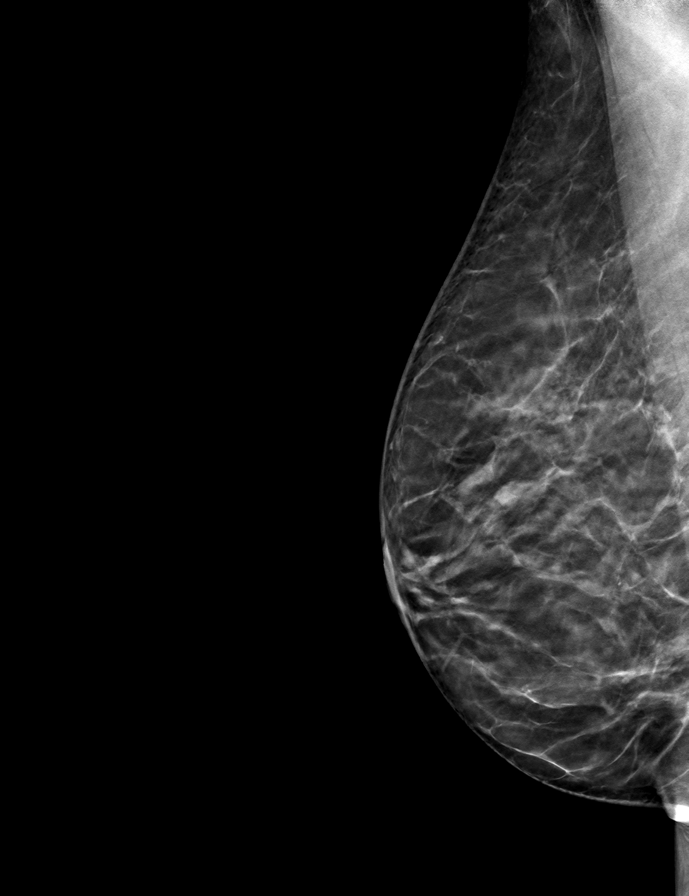

[R CC tomo · tomo slice 33/66.0]
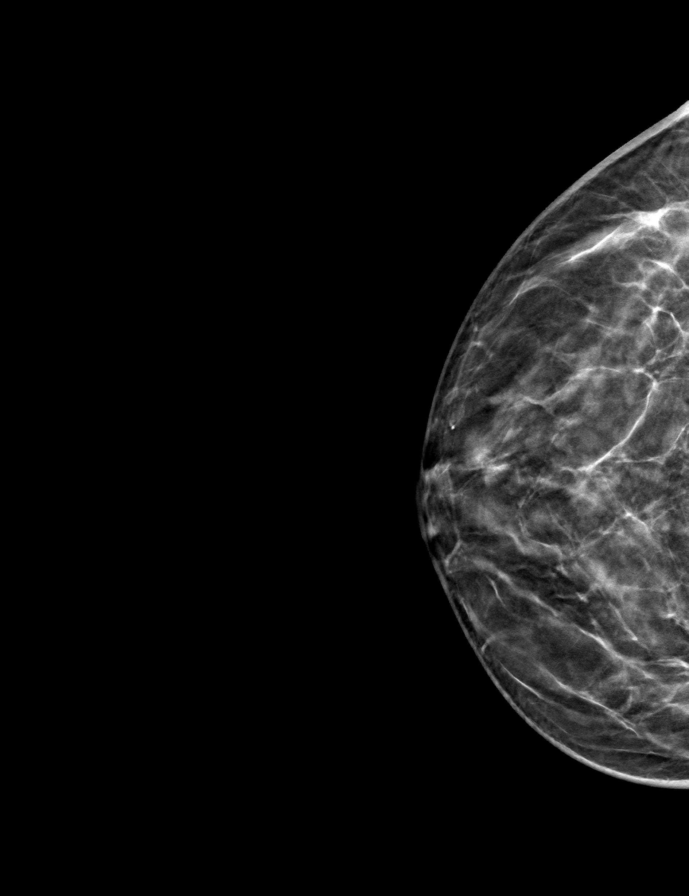

[9 of 24 positions shown; findings below may reference images not displayed]

ACR Breast Density Category c: The breast tissue is heterogeneously
dense, which may obscure small masses.
FINDINGS: There are no findings suspicious for malignancy.
IMPRESSION: No mammographic evidence of malignancy. A result letter of this
screening mammogram will be mailed directly to the patient.

RECOMMENDATION:
Screening mammogram in one year. (Code:Q3-W-BC3)

BI-RADS CATEGORY  1: Negative.

## 2022-07-06 ENCOUNTER — Other Ambulatory Visit (HOSPITAL_BASED_OUTPATIENT_CLINIC_OR_DEPARTMENT_OTHER): Payer: Self-pay

## 2022-07-07 ENCOUNTER — Other Ambulatory Visit (HOSPITAL_BASED_OUTPATIENT_CLINIC_OR_DEPARTMENT_OTHER): Payer: Self-pay

## 2022-07-19 ENCOUNTER — Other Ambulatory Visit (HOSPITAL_BASED_OUTPATIENT_CLINIC_OR_DEPARTMENT_OTHER): Payer: Self-pay

## 2022-07-23 ENCOUNTER — Other Ambulatory Visit (HOSPITAL_BASED_OUTPATIENT_CLINIC_OR_DEPARTMENT_OTHER): Payer: Self-pay

## 2022-08-27 ENCOUNTER — Other Ambulatory Visit (HOSPITAL_BASED_OUTPATIENT_CLINIC_OR_DEPARTMENT_OTHER): Payer: Self-pay

## 2022-08-30 DIAGNOSIS — H401131 Primary open-angle glaucoma, bilateral, mild stage: Secondary | ICD-10-CM | POA: Diagnosis not present

## 2022-09-06 DIAGNOSIS — M0579 Rheumatoid arthritis with rheumatoid factor of multiple sites without organ or systems involvement: Secondary | ICD-10-CM | POA: Diagnosis not present

## 2022-09-06 DIAGNOSIS — Z6822 Body mass index (BMI) 22.0-22.9, adult: Secondary | ICD-10-CM | POA: Diagnosis not present

## 2022-09-06 DIAGNOSIS — M2550 Pain in unspecified joint: Secondary | ICD-10-CM | POA: Diagnosis not present

## 2022-09-06 DIAGNOSIS — R5382 Chronic fatigue, unspecified: Secondary | ICD-10-CM | POA: Diagnosis not present

## 2022-09-11 DIAGNOSIS — Z23 Encounter for immunization: Secondary | ICD-10-CM | POA: Diagnosis not present

## 2022-09-13 ENCOUNTER — Other Ambulatory Visit (HOSPITAL_BASED_OUTPATIENT_CLINIC_OR_DEPARTMENT_OTHER): Payer: Self-pay

## 2022-09-13 MED ORDER — COMIRNATY 30 MCG/0.3ML IM SUSY
PREFILLED_SYRINGE | INTRAMUSCULAR | 0 refills | Status: AC
  Filled 2022-09-13: qty 0.3, 1d supply, fill #0

## 2022-09-14 ENCOUNTER — Other Ambulatory Visit (HOSPITAL_BASED_OUTPATIENT_CLINIC_OR_DEPARTMENT_OTHER): Payer: Self-pay

## 2022-09-15 ENCOUNTER — Other Ambulatory Visit (HOSPITAL_BASED_OUTPATIENT_CLINIC_OR_DEPARTMENT_OTHER): Payer: Self-pay

## 2022-09-15 MED ORDER — VITAMIN D (ERGOCALCIFEROL) 1.25 MG (50000 UNIT) PO CAPS
50000.0000 [IU] | ORAL_CAPSULE | ORAL | 0 refills | Status: DC
  Filled 2022-09-15: qty 13, 91d supply, fill #0

## 2022-09-23 ENCOUNTER — Other Ambulatory Visit (HOSPITAL_BASED_OUTPATIENT_CLINIC_OR_DEPARTMENT_OTHER): Payer: Self-pay

## 2022-09-23 DIAGNOSIS — G720 Drug-induced myopathy: Secondary | ICD-10-CM | POA: Diagnosis not present

## 2022-09-23 DIAGNOSIS — E559 Vitamin D deficiency, unspecified: Secondary | ICD-10-CM | POA: Diagnosis not present

## 2022-09-23 DIAGNOSIS — Z0000 Encounter for general adult medical examination without abnormal findings: Secondary | ICD-10-CM | POA: Diagnosis not present

## 2022-09-23 DIAGNOSIS — E7800 Pure hypercholesterolemia, unspecified: Secondary | ICD-10-CM | POA: Diagnosis not present

## 2022-09-23 DIAGNOSIS — R7303 Prediabetes: Secondary | ICD-10-CM | POA: Diagnosis not present

## 2022-09-23 DIAGNOSIS — H409 Unspecified glaucoma: Secondary | ICD-10-CM | POA: Diagnosis not present

## 2022-09-23 DIAGNOSIS — N898 Other specified noninflammatory disorders of vagina: Secondary | ICD-10-CM | POA: Diagnosis not present

## 2022-09-23 DIAGNOSIS — M069 Rheumatoid arthritis, unspecified: Secondary | ICD-10-CM | POA: Diagnosis not present

## 2022-09-23 MED ORDER — ESTRADIOL 0.1 MG/GM VA CREA
TOPICAL_CREAM | VAGINAL | 5 refills | Status: AC
Start: 2022-09-23 — End: ?
  Filled 2022-09-23: qty 42.5, 90d supply, fill #0

## 2022-09-23 MED ORDER — VITAMIN D (ERGOCALCIFEROL) 1.25 MG (50000 UNIT) PO CAPS
50000.0000 [IU] | ORAL_CAPSULE | ORAL | 3 refills | Status: DC
  Filled 2022-09-23 – 2022-12-07 (×2): qty 13, 91d supply, fill #0
  Filled 2023-05-18: qty 13, 91d supply, fill #1
  Filled 2023-09-05: qty 13, 91d supply, fill #2

## 2022-09-23 MED ORDER — FENOFIBRATE 160 MG PO TABS
80.0000 mg | ORAL_TABLET | ORAL | 3 refills | Status: DC
  Filled 2022-09-23: qty 18, 84d supply, fill #0
  Filled 2023-09-05: qty 20, 30d supply, fill #0

## 2022-09-24 ENCOUNTER — Other Ambulatory Visit (HOSPITAL_BASED_OUTPATIENT_CLINIC_OR_DEPARTMENT_OTHER): Payer: Self-pay

## 2022-09-24 MED ORDER — FENOFIBRATE 54 MG PO TABS
54.0000 mg | ORAL_TABLET | ORAL | 4 refills | Status: DC
  Filled 2022-09-24: qty 38, 89d supply, fill #0
  Filled 2023-09-12: qty 40, 90d supply, fill #1

## 2022-09-28 ENCOUNTER — Other Ambulatory Visit (HOSPITAL_BASED_OUTPATIENT_CLINIC_OR_DEPARTMENT_OTHER): Payer: Self-pay

## 2022-10-06 ENCOUNTER — Other Ambulatory Visit (HOSPITAL_BASED_OUTPATIENT_CLINIC_OR_DEPARTMENT_OTHER): Payer: Self-pay

## 2022-10-07 ENCOUNTER — Encounter (HOSPITAL_BASED_OUTPATIENT_CLINIC_OR_DEPARTMENT_OTHER): Payer: Self-pay | Admitting: Pharmacist

## 2022-10-07 ENCOUNTER — Other Ambulatory Visit (HOSPITAL_BASED_OUTPATIENT_CLINIC_OR_DEPARTMENT_OTHER): Payer: Self-pay

## 2022-10-07 MED ORDER — DORZOLAMIDE HCL 2 % OP SOLN
1.0000 [drp] | Freq: Two times a day (BID) | OPHTHALMIC | 98 refills | Status: DC
  Filled 2022-10-07: qty 10, 50d supply, fill #0
  Filled 2022-11-11: qty 10, 90d supply, fill #0
  Filled 2023-03-15: qty 10, 90d supply, fill #1
  Filled 2023-05-18: qty 10, 50d supply, fill #2
  Filled 2023-09-05: qty 10, 50d supply, fill #3

## 2022-10-22 ENCOUNTER — Other Ambulatory Visit (HOSPITAL_BASED_OUTPATIENT_CLINIC_OR_DEPARTMENT_OTHER): Payer: Self-pay

## 2022-11-11 ENCOUNTER — Other Ambulatory Visit (HOSPITAL_BASED_OUTPATIENT_CLINIC_OR_DEPARTMENT_OTHER): Payer: Self-pay

## 2022-12-07 ENCOUNTER — Other Ambulatory Visit: Payer: Self-pay

## 2022-12-07 ENCOUNTER — Other Ambulatory Visit (HOSPITAL_BASED_OUTPATIENT_CLINIC_OR_DEPARTMENT_OTHER): Payer: Self-pay

## 2022-12-07 MED ORDER — BRIMONIDINE TARTRATE-TIMOLOL 0.2-0.5 % OP SOLN
1.0000 [drp] | Freq: Two times a day (BID) | OPHTHALMIC | 4 refills | Status: DC
  Filled 2022-12-07: qty 15, 75d supply, fill #0

## 2022-12-07 MED ORDER — "TUBERCULIN SYRINGE 27G X 1/2"" 1 ML MISC"
4 refills | Status: AC
  Filled 2022-12-07: qty 13, 90d supply, fill #0

## 2022-12-07 MED ORDER — FOLIC ACID 1 MG PO TABS
1.0000 mg | ORAL_TABLET | Freq: Every day | ORAL | 2 refills | Status: DC
  Filled 2022-12-07 – 2023-05-18 (×2): qty 90, 90d supply, fill #0
  Filled 2023-11-07: qty 90, 90d supply, fill #1

## 2022-12-07 MED ORDER — METHOTREXATE SODIUM CHEMO INJECTION 50 MG/2ML
INTRAMUSCULAR | 0 refills | Status: AC
  Filled 2022-12-07: qty 8, 90d supply, fill #0

## 2022-12-08 ENCOUNTER — Other Ambulatory Visit (HOSPITAL_BASED_OUTPATIENT_CLINIC_OR_DEPARTMENT_OTHER): Payer: Self-pay

## 2022-12-09 ENCOUNTER — Other Ambulatory Visit (HOSPITAL_BASED_OUTPATIENT_CLINIC_OR_DEPARTMENT_OTHER): Payer: Self-pay

## 2022-12-10 ENCOUNTER — Other Ambulatory Visit (HOSPITAL_BASED_OUTPATIENT_CLINIC_OR_DEPARTMENT_OTHER): Payer: Self-pay

## 2022-12-10 ENCOUNTER — Other Ambulatory Visit (HOSPITAL_COMMUNITY): Payer: Self-pay

## 2022-12-10 MED ORDER — BRIMONIDINE TARTRATE-TIMOLOL 0.2-0.5 % OP SOLN
1.0000 [drp] | Freq: Two times a day (BID) | OPHTHALMIC | 4 refills | Status: AC
  Filled 2022-12-10: qty 20, 100d supply, fill #0
  Filled 2023-05-18: qty 20, 100d supply, fill #1
  Filled 2023-09-05: qty 20, 100d supply, fill #2

## 2022-12-13 DIAGNOSIS — M0579 Rheumatoid arthritis with rheumatoid factor of multiple sites without organ or systems involvement: Secondary | ICD-10-CM | POA: Diagnosis not present

## 2023-01-03 ENCOUNTER — Other Ambulatory Visit: Payer: Self-pay | Admitting: Family Medicine

## 2023-01-03 DIAGNOSIS — Z1231 Encounter for screening mammogram for malignant neoplasm of breast: Secondary | ICD-10-CM

## 2023-01-18 DIAGNOSIS — R42 Dizziness and giddiness: Secondary | ICD-10-CM | POA: Diagnosis not present

## 2023-02-21 DIAGNOSIS — R7303 Prediabetes: Secondary | ICD-10-CM | POA: Diagnosis not present

## 2023-03-03 DIAGNOSIS — H401131 Primary open-angle glaucoma, bilateral, mild stage: Secondary | ICD-10-CM | POA: Diagnosis not present

## 2023-03-03 DIAGNOSIS — Z961 Presence of intraocular lens: Secondary | ICD-10-CM | POA: Diagnosis not present

## 2023-03-14 ENCOUNTER — Ambulatory Visit
Admission: RE | Admit: 2023-03-14 | Discharge: 2023-03-14 | Disposition: A | Discharge status: Home / Self Care | Payer: PPO | Source: Ambulatory Visit | Attending: Family Medicine | Admitting: Family Medicine

## 2023-03-14 DIAGNOSIS — Z1231 Encounter for screening mammogram for malignant neoplasm of breast: Secondary | ICD-10-CM | POA: Diagnosis not present

## 2023-03-14 DIAGNOSIS — M0579 Rheumatoid arthritis with rheumatoid factor of multiple sites without organ or systems involvement: Secondary | ICD-10-CM | POA: Diagnosis not present

## 2023-03-14 DIAGNOSIS — R5382 Chronic fatigue, unspecified: Secondary | ICD-10-CM | POA: Diagnosis not present

## 2023-03-14 DIAGNOSIS — Z6823 Body mass index (BMI) 23.0-23.9, adult: Secondary | ICD-10-CM | POA: Diagnosis not present

## 2023-03-15 ENCOUNTER — Other Ambulatory Visit (HOSPITAL_BASED_OUTPATIENT_CLINIC_OR_DEPARTMENT_OTHER): Payer: Self-pay

## 2023-05-17 DIAGNOSIS — R7303 Prediabetes: Secondary | ICD-10-CM | POA: Diagnosis not present

## 2023-05-18 ENCOUNTER — Other Ambulatory Visit: Payer: Self-pay

## 2023-05-18 ENCOUNTER — Other Ambulatory Visit (HOSPITAL_BASED_OUTPATIENT_CLINIC_OR_DEPARTMENT_OTHER): Payer: Self-pay

## 2023-05-19 ENCOUNTER — Other Ambulatory Visit: Payer: Self-pay

## 2023-06-13 DIAGNOSIS — M0579 Rheumatoid arthritis with rheumatoid factor of multiple sites without organ or systems involvement: Secondary | ICD-10-CM | POA: Diagnosis not present

## 2023-08-06 DIAGNOSIS — Z23 Encounter for immunization: Secondary | ICD-10-CM | POA: Diagnosis not present

## 2023-09-02 ENCOUNTER — Other Ambulatory Visit (HOSPITAL_BASED_OUTPATIENT_CLINIC_OR_DEPARTMENT_OTHER): Payer: Self-pay

## 2023-09-02 MED ORDER — COVID-19 MRNA VAC-TRIS(PFIZER) 30 MCG/0.3ML IM SUSY
0.3000 mL | PREFILLED_SYRINGE | Freq: Once | INTRAMUSCULAR | 0 refills | Status: AC
  Filled 2023-09-02: qty 0.3, 1d supply, fill #0

## 2023-09-05 ENCOUNTER — Other Ambulatory Visit (HOSPITAL_BASED_OUTPATIENT_CLINIC_OR_DEPARTMENT_OTHER): Payer: Self-pay

## 2023-09-05 ENCOUNTER — Other Ambulatory Visit: Payer: Self-pay

## 2023-09-06 ENCOUNTER — Other Ambulatory Visit (HOSPITAL_BASED_OUTPATIENT_CLINIC_OR_DEPARTMENT_OTHER): Payer: Self-pay

## 2023-09-06 ENCOUNTER — Other Ambulatory Visit: Payer: Self-pay

## 2023-09-06 MED ORDER — TRAVOPROST (BAK FREE) 0.004 % OP SOLN
1.0000 [drp] | Freq: Every day | OPHTHALMIC | 3 refills | Status: AC
  Filled 2023-09-06: qty 7.5, 75d supply, fill #0
  Filled 2023-09-06: qty 5, 30d supply, fill #0
  Filled 2023-09-06: qty 2.5, 25d supply, fill #0

## 2023-09-07 ENCOUNTER — Other Ambulatory Visit (HOSPITAL_BASED_OUTPATIENT_CLINIC_OR_DEPARTMENT_OTHER): Payer: Self-pay

## 2023-09-07 DIAGNOSIS — H401131 Primary open-angle glaucoma, bilateral, mild stage: Secondary | ICD-10-CM | POA: Diagnosis not present

## 2023-09-12 ENCOUNTER — Other Ambulatory Visit (HOSPITAL_BASED_OUTPATIENT_CLINIC_OR_DEPARTMENT_OTHER): Payer: Self-pay

## 2023-09-12 DIAGNOSIS — R5382 Chronic fatigue, unspecified: Secondary | ICD-10-CM | POA: Diagnosis not present

## 2023-09-12 DIAGNOSIS — Z6822 Body mass index (BMI) 22.0-22.9, adult: Secondary | ICD-10-CM | POA: Diagnosis not present

## 2023-09-12 DIAGNOSIS — M0579 Rheumatoid arthritis with rheumatoid factor of multiple sites without organ or systems involvement: Secondary | ICD-10-CM | POA: Diagnosis not present

## 2023-09-13 ENCOUNTER — Other Ambulatory Visit (HOSPITAL_BASED_OUTPATIENT_CLINIC_OR_DEPARTMENT_OTHER): Payer: Self-pay

## 2023-09-21 ENCOUNTER — Other Ambulatory Visit (HOSPITAL_BASED_OUTPATIENT_CLINIC_OR_DEPARTMENT_OTHER): Payer: Self-pay

## 2023-11-07 ENCOUNTER — Other Ambulatory Visit (HOSPITAL_BASED_OUTPATIENT_CLINIC_OR_DEPARTMENT_OTHER): Payer: Self-pay

## 2023-11-07 ENCOUNTER — Other Ambulatory Visit: Payer: Self-pay

## 2023-11-07 MED ORDER — FENOFIBRATE 54 MG PO TABS
54.0000 mg | ORAL_TABLET | ORAL | 1 refills | Status: AC
  Filled 2024-10-05: qty 40, 94d supply, fill #0

## 2023-11-07 MED ORDER — VITAMIN D (ERGOCALCIFEROL) 1.25 MG (50000 UNIT) PO CAPS
50000.0000 [IU] | ORAL_CAPSULE | ORAL | 1 refills | Status: DC
  Filled 2023-12-08: qty 13, 91d supply, fill #0
  Filled 2024-06-04: qty 13, 91d supply, fill #1

## 2023-11-09 ENCOUNTER — Other Ambulatory Visit (HOSPITAL_BASED_OUTPATIENT_CLINIC_OR_DEPARTMENT_OTHER): Payer: Self-pay

## 2023-11-09 MED ORDER — DORZOLAMIDE HCL 2 % OP SOLN
1.0000 [drp] | Freq: Two times a day (BID) | OPHTHALMIC | 3 refills | Status: AC
  Filled 2023-11-09: qty 10, 100d supply, fill #0

## 2023-12-08 ENCOUNTER — Other Ambulatory Visit (HOSPITAL_BASED_OUTPATIENT_CLINIC_OR_DEPARTMENT_OTHER): Payer: Self-pay

## 2023-12-09 ENCOUNTER — Other Ambulatory Visit (HOSPITAL_BASED_OUTPATIENT_CLINIC_OR_DEPARTMENT_OTHER): Payer: Self-pay

## 2023-12-12 ENCOUNTER — Other Ambulatory Visit (HOSPITAL_BASED_OUTPATIENT_CLINIC_OR_DEPARTMENT_OTHER): Payer: Self-pay

## 2023-12-19 ENCOUNTER — Other Ambulatory Visit (HOSPITAL_BASED_OUTPATIENT_CLINIC_OR_DEPARTMENT_OTHER): Payer: Self-pay

## 2023-12-19 DIAGNOSIS — M0579 Rheumatoid arthritis with rheumatoid factor of multiple sites without organ or systems involvement: Secondary | ICD-10-CM | POA: Diagnosis not present

## 2024-01-16 DIAGNOSIS — Z1159 Encounter for screening for other viral diseases: Secondary | ICD-10-CM | POA: Diagnosis not present

## 2024-02-16 ENCOUNTER — Other Ambulatory Visit (HOSPITAL_BASED_OUTPATIENT_CLINIC_OR_DEPARTMENT_OTHER): Payer: Self-pay

## 2024-02-20 ENCOUNTER — Other Ambulatory Visit (HOSPITAL_BASED_OUTPATIENT_CLINIC_OR_DEPARTMENT_OTHER): Payer: Self-pay

## 2024-02-20 MED ORDER — "TUBERCULIN SYRINGE 27G X 1/2"" 1 ML MISC"
1.0000 | 4 refills | Status: AC
  Filled 2024-02-20: qty 13, 91d supply, fill #0
  Filled 2024-09-19: qty 13, 91d supply, fill #1

## 2024-02-21 ENCOUNTER — Other Ambulatory Visit (HOSPITAL_BASED_OUTPATIENT_CLINIC_OR_DEPARTMENT_OTHER): Payer: Self-pay

## 2024-03-16 ENCOUNTER — Other Ambulatory Visit (HOSPITAL_BASED_OUTPATIENT_CLINIC_OR_DEPARTMENT_OTHER): Payer: Self-pay

## 2024-04-10 ENCOUNTER — Other Ambulatory Visit (HOSPITAL_BASED_OUTPATIENT_CLINIC_OR_DEPARTMENT_OTHER): Payer: Self-pay

## 2024-04-10 DIAGNOSIS — Z6822 Body mass index (BMI) 22.0-22.9, adult: Secondary | ICD-10-CM | POA: Diagnosis not present

## 2024-04-10 DIAGNOSIS — R5382 Chronic fatigue, unspecified: Secondary | ICD-10-CM | POA: Diagnosis not present

## 2024-04-10 DIAGNOSIS — M0579 Rheumatoid arthritis with rheumatoid factor of multiple sites without organ or systems involvement: Secondary | ICD-10-CM | POA: Diagnosis not present

## 2024-04-10 MED ORDER — METHOTREXATE SODIUM CHEMO INJECTION 50 MG/2ML
INTRAMUSCULAR | 1 refills | Status: AC
  Filled 2024-04-10: qty 6, 84d supply, fill #0

## 2024-04-23 DIAGNOSIS — Z961 Presence of intraocular lens: Secondary | ICD-10-CM | POA: Diagnosis not present

## 2024-04-23 DIAGNOSIS — H401131 Primary open-angle glaucoma, bilateral, mild stage: Secondary | ICD-10-CM | POA: Diagnosis not present

## 2024-06-19 ENCOUNTER — Other Ambulatory Visit (HOSPITAL_BASED_OUTPATIENT_CLINIC_OR_DEPARTMENT_OTHER): Payer: Self-pay

## 2024-06-21 ENCOUNTER — Other Ambulatory Visit (HOSPITAL_BASED_OUTPATIENT_CLINIC_OR_DEPARTMENT_OTHER): Payer: Self-pay

## 2024-06-21 MED ORDER — BRIMONIDINE TARTRATE-TIMOLOL 0.2-0.5 % OP SOLN
1.0000 [drp] | Freq: Two times a day (BID) | OPHTHALMIC | 12 refills | Status: AC
  Filled 2024-06-21 – 2024-07-09 (×2): qty 10, 50d supply, fill #0
  Filled 2024-11-04: qty 10, 50d supply, fill #1

## 2024-06-22 ENCOUNTER — Other Ambulatory Visit (HOSPITAL_BASED_OUTPATIENT_CLINIC_OR_DEPARTMENT_OTHER): Payer: Self-pay

## 2024-07-02 ENCOUNTER — Other Ambulatory Visit (HOSPITAL_BASED_OUTPATIENT_CLINIC_OR_DEPARTMENT_OTHER): Payer: Self-pay

## 2024-07-09 ENCOUNTER — Other Ambulatory Visit (HOSPITAL_BASED_OUTPATIENT_CLINIC_OR_DEPARTMENT_OTHER): Payer: Self-pay

## 2024-07-09 ENCOUNTER — Other Ambulatory Visit: Payer: Self-pay

## 2024-07-10 DIAGNOSIS — M0579 Rheumatoid arthritis with rheumatoid factor of multiple sites without organ or systems involvement: Secondary | ICD-10-CM | POA: Diagnosis not present

## 2024-07-17 ENCOUNTER — Other Ambulatory Visit: Payer: Self-pay | Admitting: Family Medicine

## 2024-07-17 DIAGNOSIS — Z1231 Encounter for screening mammogram for malignant neoplasm of breast: Secondary | ICD-10-CM

## 2024-08-07 ENCOUNTER — Ambulatory Visit
Admission: RE | Admit: 2024-08-07 | Discharge: 2024-08-07 | Disposition: A | Discharge status: Home / Self Care | Source: Ambulatory Visit | Attending: Family Medicine | Admitting: Family Medicine

## 2024-08-07 DIAGNOSIS — Z1231 Encounter for screening mammogram for malignant neoplasm of breast: Secondary | ICD-10-CM

## 2024-08-08 ENCOUNTER — Other Ambulatory Visit (HOSPITAL_BASED_OUTPATIENT_CLINIC_OR_DEPARTMENT_OTHER): Payer: Self-pay

## 2024-08-09 ENCOUNTER — Other Ambulatory Visit (HOSPITAL_BASED_OUTPATIENT_CLINIC_OR_DEPARTMENT_OTHER): Payer: Self-pay

## 2024-08-09 ENCOUNTER — Other Ambulatory Visit: Payer: Self-pay

## 2024-08-09 MED ORDER — FOLIC ACID 1 MG PO TABS
1.0000 mg | ORAL_TABLET | Freq: Every day | ORAL | 5 refills | Status: AC
  Filled 2024-08-09: qty 30, 30d supply, fill #0
  Filled 2024-09-19: qty 30, 30d supply, fill #1
  Filled 2024-11-04: qty 30, 30d supply, fill #2

## 2024-08-10 ENCOUNTER — Other Ambulatory Visit (HOSPITAL_BASED_OUTPATIENT_CLINIC_OR_DEPARTMENT_OTHER): Payer: Self-pay

## 2024-08-24 ENCOUNTER — Other Ambulatory Visit (HOSPITAL_BASED_OUTPATIENT_CLINIC_OR_DEPARTMENT_OTHER): Payer: Self-pay

## 2024-08-24 MED ORDER — COMIRNATY 30 MCG/0.3ML IM SUSY
0.3000 mL | PREFILLED_SYRINGE | Freq: Once | INTRAMUSCULAR | 0 refills | Status: AC
  Filled 2024-08-24: qty 0.3, 1d supply, fill #0

## 2024-09-08 DIAGNOSIS — Z23 Encounter for immunization: Secondary | ICD-10-CM | POA: Diagnosis not present

## 2024-09-19 ENCOUNTER — Other Ambulatory Visit (HOSPITAL_BASED_OUTPATIENT_CLINIC_OR_DEPARTMENT_OTHER): Payer: Self-pay

## 2024-09-19 MED ORDER — VITAMIN D (ERGOCALCIFEROL) 1.25 MG (50000 UNIT) PO CAPS
50000.0000 [IU] | ORAL_CAPSULE | ORAL | 1 refills | Status: AC
  Filled 2024-09-19: qty 12, 84d supply, fill #0
  Filled 2024-12-24: qty 12, 84d supply, fill #1

## 2024-09-20 ENCOUNTER — Other Ambulatory Visit: Payer: Self-pay

## 2024-10-01 DIAGNOSIS — H409 Unspecified glaucoma: Secondary | ICD-10-CM | POA: Diagnosis not present

## 2024-10-01 DIAGNOSIS — G720 Drug-induced myopathy: Secondary | ICD-10-CM | POA: Diagnosis not present

## 2024-10-01 DIAGNOSIS — E559 Vitamin D deficiency, unspecified: Secondary | ICD-10-CM | POA: Diagnosis not present

## 2024-10-01 DIAGNOSIS — E119 Type 2 diabetes mellitus without complications: Secondary | ICD-10-CM | POA: Diagnosis not present

## 2024-10-01 DIAGNOSIS — E7800 Pure hypercholesterolemia, unspecified: Secondary | ICD-10-CM | POA: Diagnosis not present

## 2024-10-01 DIAGNOSIS — M069 Rheumatoid arthritis, unspecified: Secondary | ICD-10-CM | POA: Diagnosis not present

## 2024-10-06 ENCOUNTER — Other Ambulatory Visit (HOSPITAL_BASED_OUTPATIENT_CLINIC_OR_DEPARTMENT_OTHER): Payer: Self-pay

## 2024-10-09 ENCOUNTER — Other Ambulatory Visit (HOSPITAL_BASED_OUTPATIENT_CLINIC_OR_DEPARTMENT_OTHER): Payer: Self-pay

## 2024-10-09 DIAGNOSIS — M0579 Rheumatoid arthritis with rheumatoid factor of multiple sites without organ or systems involvement: Secondary | ICD-10-CM | POA: Diagnosis not present

## 2024-10-09 DIAGNOSIS — R5382 Chronic fatigue, unspecified: Secondary | ICD-10-CM | POA: Diagnosis not present

## 2024-10-09 DIAGNOSIS — Z6822 Body mass index (BMI) 22.0-22.9, adult: Secondary | ICD-10-CM | POA: Diagnosis not present

## 2024-10-31 DIAGNOSIS — H401132 Primary open-angle glaucoma, bilateral, moderate stage: Secondary | ICD-10-CM | POA: Diagnosis not present

## 2024-11-05 ENCOUNTER — Other Ambulatory Visit (HOSPITAL_BASED_OUTPATIENT_CLINIC_OR_DEPARTMENT_OTHER): Payer: Self-pay
